# Patient Record
Sex: Male | Born: 1960 | Race: White | Hispanic: No | Marital: Married | Smoking: Former smoker
Health system: Southern US, Community
[De-identification: ages and names within clinical notes are randomized; demographics above are authoritative.]

## PROBLEM LIST (undated history)

## (undated) DIAGNOSIS — J449 Chronic obstructive pulmonary disease, unspecified: Secondary | ICD-10-CM

## (undated) DIAGNOSIS — J45909 Unspecified asthma, uncomplicated: Secondary | ICD-10-CM

## (undated) HISTORY — DX: Unspecified asthma, uncomplicated: J45.909

## (undated) HISTORY — PX: SPINE SURGERY: SHX786

---

## 1999-02-03 ENCOUNTER — Encounter: Payer: Self-pay | Admitting: *Deleted

## 1999-02-03 ENCOUNTER — Observation Stay (HOSPITAL_COMMUNITY): Admission: EM | Admit: 1999-02-03 | Discharge: 1999-02-04 | Payer: Self-pay | Admitting: Emergency Medicine

## 1999-02-04 ENCOUNTER — Encounter: Payer: Self-pay | Admitting: Cardiovascular Disease

## 2008-01-02 ENCOUNTER — Encounter: Admission: RE | Admit: 2008-01-02 | Discharge: 2008-01-02 | Payer: Self-pay | Admitting: Neurosurgery

## 2008-01-15 ENCOUNTER — Encounter: Admission: RE | Admit: 2008-01-15 | Discharge: 2008-01-15 | Payer: Self-pay | Admitting: Neurosurgery

## 2008-11-04 ENCOUNTER — Encounter: Admission: RE | Admit: 2008-11-04 | Discharge: 2008-11-04 | Payer: Self-pay | Admitting: Neurosurgery

## 2008-11-07 ENCOUNTER — Encounter: Admission: RE | Admit: 2008-11-07 | Discharge: 2008-11-07 | Payer: Self-pay | Admitting: Neurosurgery

## 2008-11-25 ENCOUNTER — Encounter: Admission: RE | Admit: 2008-11-25 | Discharge: 2008-11-25 | Payer: Self-pay | Admitting: Neurosurgery

## 2008-12-17 ENCOUNTER — Encounter: Admission: RE | Admit: 2008-12-17 | Discharge: 2008-12-17 | Payer: Self-pay | Admitting: Neurosurgery

## 2020-06-24 LAB — HM COLONOSCOPY

## 2021-01-23 ENCOUNTER — Emergency Department (HOSPITAL_COMMUNITY): Payer: 59

## 2021-01-23 ENCOUNTER — Emergency Department (HOSPITAL_COMMUNITY)
Admission: EM | Admit: 2021-01-23 | Discharge: 2021-01-24 | Disposition: A | Discharge status: Home / Self Care | Payer: 59 | Attending: Emergency Medicine | Admitting: Emergency Medicine

## 2021-01-23 DIAGNOSIS — Z8616 Personal history of COVID-19: Secondary | ICD-10-CM | POA: Diagnosis not present

## 2021-01-23 DIAGNOSIS — Z20822 Contact with and (suspected) exposure to covid-19: Secondary | ICD-10-CM | POA: Diagnosis not present

## 2021-01-23 DIAGNOSIS — R5383 Other fatigue: Secondary | ICD-10-CM | POA: Diagnosis not present

## 2021-01-23 DIAGNOSIS — R0602 Shortness of breath: Secondary | ICD-10-CM | POA: Diagnosis not present

## 2021-01-23 NOTE — ED Provider Notes (Signed)
[facility] EMERGENCY DEPARTMENT Provider Note   CSN: [id number] Arrival date & time: 01/23/21  2303     History Chief Complaint  Patient presents with   Shortness of Breath    Anthony [name] is a 60 y.o. male.  60 y/o male with hx of COPD presents to the ED for SOB and fatigue. Patient reports that his symptoms began acutely at 1800 shortly after arriving at work. They worsened about 2.5 hours later when he was on his break. Began to notice progressive SOB, aggravated by exertion. Tried his albuterol inhaler without relief. Next went to drink his [facility], but felt it didn't taste right. Expresses concern for COVID, but does report hx of COVID positivity in June. Reports subjective fever and chills. No nausea, vomiting, chest pain, syncope. Is a nonsmoker.   The history is provided by the patient. No language interpreter was used.  Shortness of Breath     No past medical history on file.  There are no problems to display for this patient.   **The histories are not reviewed yet. Please review them in the "History" navigator section and refresh this SmartLink.     No family history on file.     Home Medications Prior to Admission medications   Medication Sig Start Date End Date Taking? Authorizing Provider  albuterol (VENTOLIN HFA) 108 (90 Base) MCG/ACT inhaler Inhale 2 puffs into the lungs every 4 (four) hours as needed for wheezing or shortness of breath. 01/24/21  Yes Antony Madura, PA-C    Allergies    Patient has no allergy information on record.  Review of Systems   Review of Systems  Respiratory:  Positive for shortness of breath.   Ten systems reviewed and are negative for acute change, except as noted in the HPI.    Physical Exam Updated Vital Signs BP 126/82   Pulse (!) 57   Temp 97.7 F (36.5 C) (Oral)   Resp 12   Ht 6' (1.829 m)   Wt 107 kg   SpO2 94%   BMI 32.01 kg/m   Physical Exam Vitals and nursing note reviewed.   Constitutional:      General: He is not in acute distress.    Appearance: He is well-developed. He is not diaphoretic.     Comments: Nontoxic appearing male, in NAD  HENT:     Head: Normocephalic and atraumatic.  Eyes:     General: No scleral icterus.    Conjunctiva/sclera: Conjunctivae normal.  Cardiovascular:     Rate and Rhythm: Normal rate and regular rhythm.     Pulses: Normal pulses.  Pulmonary:     Effort: Pulmonary effort is normal. No respiratory distress.     Breath sounds: No stridor. No wheezing, rhonchi or rales.     Comments: Lungs CTAB. No cough appreciated. Sats 97% on RA. Musculoskeletal:        General: Normal range of motion.     Cervical back: Normal range of motion.     Comments: No asymmetric lower extremity edema.  Skin:    General: Skin is warm and dry.     Coloration: Skin is not pale.     Findings: No erythema or rash.  Neurological:     Mental Status: He is alert and oriented to person, place, and time.     Coordination: Coordination normal.  Psychiatric:        Behavior: Behavior normal.    ED Results / Procedures / Treatments  Labs (all labs ordered are listed, but only abnormal results are displayed) Labs Reviewed  BASIC METABOLIC PANEL - Abnormal; Notable for the following components:      Result Value   Potassium 3.4 (*)    Glucose, Bld 118 (*)    BUN 22 (*)    Calcium 8.6 (*)    All other components within normal limits  SARS CORONAVIRUS 2 (TAT 6-24 HRS)  CBC WITH DIFFERENTIAL/PLATELET  TROPONIN I (HIGH SENSITIVITY)  TROPONIN I (HIGH SENSITIVITY)    EKG EKG Interpretation  Date/Time:  Saturday January 23 2021 23:09:48 EDT Ventricular Rate:  77 PR Interval:  139 QRS Duration: 96 QT Interval:  380 QTC Calculation: 430 R Axis:   62 Text Interpretation: Sinus rhythm Normal ECG Confirmed by Geoffery Lyons ([id number]) on 01/23/2021 11:14:21 PM Also confirmed by Geoffery Lyons ([id number]), editor Jillene Bucks [phone])  on 01/24/2021 1:06:52  PM  Radiology DG Chest 2 View  Result Date: 01/23/2021 CLINICAL DATA:  Worsening shortness of breath, fatigue, night sweats, and loss of taste. EXAM: CHEST - 2 VIEW COMPARISON:  None. FINDINGS: Heart size and pulmonary vascularity are normal. Lungs are clear. No pleural effusions. No pneumothorax. Mediastinal contours appear intact. Degenerative changes in the spine. IMPRESSION: No active cardiopulmonary disease. Electronically Signed   By: Burman Nieves M.D.   On: 01/23/2021 23:47     Procedures Procedures   Medications Ordered in ED Medications - No data to display  ED Course  I have reviewed the triage vital signs and the nursing notes.  Pertinent labs & imaging results that were available during my care of the patient were reviewed by me and considered in my medical decision making (see chart for details).  Clinical Course as of 01/27/21 2311  Sun Jan 24, 2021  0129 Labs reviewed and reassuring. VSS. [KH]    Clinical Course User Index [KH] Antony Madura, PA-C   MDM Rules/Calculators/A&P                           Patient presents to the emergency department for evaluation of shortness of breath.  Atypical cardiac etiology considered, but  EKG is nonischemic and troponin negative x 2.  Chest x-ray without evidence of mediastinal widening to suggest dissection.  No pneumothorax, pneumonia, pleural effusion.  COVID test pending, but felt unlikely given reported infection in June.  The patient has become progressively asymptomatic while in the emergency department.  Overall, he reports he is feeling much better.  Denies chest pain.  Has not had any hypoxia on room air.  Question COPD exacerbation.  He is stable for follow-up with his primary care doctor.  Return precautions discussed and provided. Patient discharged in stable condition with no unaddressed concerns.   Final Clinical Impression(s) / ED Diagnoses Final diagnoses:  SOB (shortness of breath)    Rx / DC Orders ED  Discharge Orders          Ordered    albuterol (VENTOLIN HFA) 108 (90 Base) MCG/ACT inhaler  Every 4 hours PRN        01/24/21 0316             Antony Madura, PA-C 01/27/21 2314    Geoffery Lyons, MD 02/02/21 [phone]

## 2021-01-23 NOTE — ED Triage Notes (Signed)
Pt bib ems c/o of worsening sob, fatigue, night sweats, and loss of taste. Pt tried to use albuterol  inhaler with no relief for SOB. Pt states he was exposed to covid in the past week. Hx of COPD.   BP: 138/90  HR: 60-90  RR: 24  T: 97.7  CBG: 163  Spo2: 96% RA

## 2021-01-24 LAB — CBC WITH DIFFERENTIAL/PLATELET
Abs Immature Granulocytes: 0.02 10*3/uL (ref 0.00–0.07)
Basophils Absolute: 0.1 10*3/uL (ref 0.0–0.1)
Basophils Relative: 1 %
Eosinophils Absolute: 0.1 10*3/uL (ref 0.0–0.5)
Eosinophils Relative: 1 %
HCT: 41.2 % (ref 39.0–52.0)
Hemoglobin: 14.1 g/dL (ref 13.0–17.0)
Immature Granulocytes: 0 %
Lymphocytes Relative: 30 %
Lymphs Abs: 2.9 10*3/uL (ref 0.7–4.0)
MCH: 32.3 pg (ref 26.0–34.0)
MCHC: 34.2 g/dL (ref 30.0–36.0)
MCV: 94.3 fL (ref 80.0–100.0)
Monocytes Absolute: 0.8 10*3/uL (ref 0.1–1.0)
Monocytes Relative: 9 %
Neutro Abs: 5.7 10*3/uL (ref 1.7–7.7)
Neutrophils Relative %: 59 %
Platelets: 177 10*3/uL (ref 150–400)
RBC: 4.37 MIL/uL (ref 4.22–5.81)
RDW: 11.9 % (ref 11.5–15.5)
WBC: 9.7 10*3/uL (ref 4.0–10.5)
nRBC: 0 % (ref 0.0–0.2)

## 2021-01-24 LAB — SARS CORONAVIRUS 2 (TAT 6-24 HRS): SARS Coronavirus 2: NEGATIVE

## 2021-01-24 LAB — BASIC METABOLIC PANEL
Anion gap: 7 (ref 5–15)
BUN: 22 mg/dL — ABNORMAL HIGH (ref 6–20)
CO2: 23 mmol/L (ref 22–32)
Calcium: 8.6 mg/dL — ABNORMAL LOW (ref 8.9–10.3)
Chloride: 108 mmol/L (ref 98–111)
Creatinine, Ser: 0.85 mg/dL (ref 0.61–1.24)
GFR, Estimated: >60 mL/min (ref >60)
Glucose, Bld: 118 mg/dL — ABNORMAL HIGH (ref 70–99)
Potassium: 3.4 mmol/L — ABNORMAL LOW (ref 3.5–5.1)
Sodium: 138 mmol/L (ref 135–145)

## 2021-01-24 LAB — TROPONIN I (HIGH SENSITIVITY)
Troponin I (High Sensitivity): 3 ng/L (ref <18)
Troponin I (High Sensitivity): 4 ng/L (ref <18)

## 2021-01-24 MED ORDER — ALBUTEROL SULFATE HFA 108 (90 BASE) MCG/ACT IN AERS: 2.0000 | INHALATION_SPRAY | RESPIRATORY_TRACT | 0 refills | Status: DC | PRN

## 2021-01-24 NOTE — Discharge Instructions (Addendum)
Your work-up in the emergency department has been reassuring.  You can follow-up on the results of your COVID test online through MyChart.  Instructions on how to access this are located in your discharge packet.  We have provided a referral to cardiology given your symptoms, especially since your last stress test was more than 20 years ago.  We advise continuation of any prescribed medications.  You have been given a refill of an albuterol inhaler given that yours was expired.  We recommend return to the emergency department for new or concerning symptoms.

## 2021-02-16 NOTE — Progress Notes (Signed)
Cardiology Office Note:    Date:  02/17/2021   ID:  Anthony [name], DOB [birth year], MRN [id number]  PCP:  Pcp, No  Cardiologist:  None   Referring MD: No ref. provider found   Chief Complaint  Patient presents with   Coronary Artery Disease   Shortness of Breath     History of Present Illness:    Anthony [name] is a 60 y.o. male with a hx of recent ER visit for DOE chest tightness.    On August 20 while working at [employer] he suddenly developed dyspnea and a sensation of tightness in the chest.  States that high it could not catch my breath.  He went to the emergency room and had a negative work-up including a normal EKG and troponin.  Chest x-ray was unremarkable.  Since that time he has not been right.  If he walks around he feels somewhat short of breath.  He saw a primary care Dr. Wyline Mood on 01/27/2021 and was given albuterol and told he was developing COPD.  He has never been a smoker but he did work in [employer] for greater than 20 years and noted terrible air quality which he feels may correlate with the potential for developing a lung problem.  Father had CAD.  A brother 6 years older recently had stents.  Brother is a smoker.  A CT scan was not performed.  D-dimer was normal.  History reviewed. No pertinent past medical history.  History reviewed. No pertinent surgical history.  Current Medications: Current Meds  Medication Sig   albuterol (VENTOLIN HFA) 108 (90 Base) MCG/ACT inhaler Inhale 2 puffs into the lungs every 4 (four) hours as needed for wheezing or shortness of breath.     Allergies:   Morphine and Pentazocine   Social History   Socioeconomic History   Marital status: Married    Spouse name: Not on file   Number of children: Not on file   Years of education: Not on file   Highest education level: Not on file  Occupational History   Not on file  Tobacco Use   Smoking status: Never   Smokeless tobacco: Never  Substance and Sexual  Activity   Alcohol use: Not on file   Drug use: Not on file   Sexual activity: Not on file  Other Topics Concern   Not on file  Social History Narrative   Not on file   Social Determinants of Health   Financial Resource Strain: Not on file  Food Insecurity: Not on file  Transportation Needs: Not on file  Physical Activity: Not on file  Stress: Not on file  Social Connections: Not on file     Family History: The patient's family history includes Asthma in his mother; Cancer in his mother; Heart disease in his father.  ROS:   Please see the history of present illness.    Cardiac imaging.  Never had chest CT.  Wonders if he can work.  Somewhat reluctant to return to work.  All other systems reviewed and are negative.  EKGs/Labs/Other Studies Reviewed:    The following studies were reviewed today: Chest x-ray, blood work, including troponin I were negative on August 21.  EKG:  EKG normal on 01/25/2021  Recent Labs: 01/23/2021: BUN 22; Creatinine, Ser 0.85; Hemoglobin 14.1; Platelets 177; Potassium 3.4; Sodium 138  Recent Lipid Panel No results found for: CHOL, TRIG, HDL, CHOLHDL, VLDL, LDLCALC, LDLDIRECT  Physical Exam:    VS:  BP  130/82   Pulse 70   Ht 6' (1.829 m)   Wt 231 lb 3.2 oz (104.9 kg)   SpO2 97%   BMI 31.36 kg/m     Wt Readings from Last 3 Encounters:  02/17/21 231 lb 3.2 oz (104.9 kg)  01/23/21 236 lb (107 kg)     GEN: Overweight. No acute distress HEENT: Normal NECK: No JVD. LYMPHATICS: No lymphadenopathy CARDIAC: No murmur. RRR no gallop, or edema. VASCULAR:  Normal Pulses. No bruits. RESPIRATORY:  Clear to auscultation without rales, wheezing or rhonchi  ABDOMEN: Soft, non-tender, non-distended, No pulsatile mass, MUSCULOSKELETAL: No deformity  SKIN: Warm and dry NEUROLOGIC:  Alert and oriented x 3 PSYCHIATRIC:  Normal affect   ASSESSMENT:    1. DOE (dyspnea on exertion)   2. Precordial pain    PLAN:    In order of problems listed  above:  2D Doppler echocardiogram, D-dimer, BNP, and coronary CT with FFR if indicated. Sublingual nitroglycerin if discomfort lasts longer than 3 to 5 minutes.  Take nitro while seated.  Add aspirin 81 mg/day.  At work if chest tightness is provoked by physical activity stop and rest.  Notify us.  If he is unable to perform his duties without chest discomfort, he may need to be out until we get to the bottom of this.   We will check a lipid panel, BNP, D-dimer, echo, and coronary CTA with FFR if indicated.  Nitro was given to use of any prolonged episodes of pain.  Instructed to modulate physical activity so that he is under the threshold of tightness in his chest.   Medication Adjustments/Labs and Tests Ordered: Current medicines are reviewed at length with the patient today.  Concerns regarding medicines are outlined above.  No orders of the defined types were placed in this encounter.  No orders of the defined types were placed in this encounter.   There are no Patient Instructions on file for this visit.   Signed, Lesleigh Noe, MD  02/17/2021 10:24 AM    [facility]

## 2021-02-16 NOTE — H&P (View-Only) (Signed)
Cardiology Office Note:    Date:  02/17/2021   ID:  Anthony [name], DOB 02-02-1961, MRN [id number]  PCP:  Pcp, No  Cardiologist:  None   Referring MD: No ref. provider found   Chief Complaint  Patient presents with   Coronary Artery Disease   Shortness of Breath     History of Present Illness:    Anthony [name] is a 60 y.o. male with a hx of recent ER visit for DOE chest tightness.    On August 20 while working at [employer] he suddenly developed dyspnea and a sensation of tightness in the chest.  States that high it could not catch my breath.  He went to the emergency room and had a negative work-up including a normal EKG and troponin.  Chest x-ray was unremarkable.  Since that time he has not been right.  If he walks around he feels somewhat short of breath.  He saw a primary care Dr. Wyline Mood on 01/27/2021 and was given albuterol and told he was developing COPD.  He has never been a smoker but he did work in [employer] for greater than 20 years and noted terrible air quality which he feels may correlate with the potential for developing a lung problem.  Father had CAD.  A brother 6 years older recently had stents.  Brother is a smoker.  A CT scan was not performed.  D-dimer was normal.  History reviewed. No pertinent past medical history.  History reviewed. No pertinent surgical history.  Current Medications: Current Meds  Medication Sig   albuterol (VENTOLIN HFA) 108 (90 Base) MCG/ACT inhaler Inhale 2 puffs into the lungs every 4 (four) hours as needed for wheezing or shortness of breath.     Allergies:   Morphine and Pentazocine   Social History   Socioeconomic History   Marital status: Married    Spouse name: Not on file   Number of children: Not on file   Years of education: Not on file   Highest education level: Not on file  Occupational History   Not on file  Tobacco Use   Smoking status: Never   Smokeless tobacco: Never  Substance and Sexual  Activity   Alcohol use: Not on file   Drug use: Not on file   Sexual activity: Not on file  Other Topics Concern   Not on file  Social History Narrative   Not on file   Social Determinants of Health   Financial Resource Strain: Not on file  Food Insecurity: Not on file  Transportation Needs: Not on file  Physical Activity: Not on file  Stress: Not on file  Social Connections: Not on file     Family History: The patient's family history includes Asthma in his mother; Cancer in his mother; Heart disease in his father.  ROS:   Please see the history of present illness.    Cardiac imaging.  Never had chest CT.  Wonders if he can work.  Somewhat reluctant to return to work.  All other systems reviewed and are negative.  EKGs/Labs/Other Studies Reviewed:    The following studies were reviewed today: Chest x-ray, blood work, including troponin I were negative on August 21.  EKG:  EKG normal on 01/25/2021  Recent Labs: 01/23/2021: BUN 22; Creatinine, Ser 0.85; Hemoglobin 14.1; Platelets 177; Potassium 3.4; Sodium 138  Recent Lipid Panel No results found for: CHOL, TRIG, HDL, CHOLHDL, VLDL, LDLCALC, LDLDIRECT  Physical Exam:    VS:  BP  130/82   Pulse 70   Ht 6' (1.829 m)   Wt 231 lb 3.2 oz (104.9 kg)   SpO2 97%   BMI 31.36 kg/m     Wt Readings from Last 3 Encounters:  02/17/21 231 lb 3.2 oz (104.9 kg)  01/23/21 236 lb (107 kg)     GEN: Overweight. No acute distress HEENT: Normal NECK: No JVD. LYMPHATICS: No lymphadenopathy CARDIAC: No murmur. RRR no gallop, or edema. VASCULAR:  Normal Pulses. No bruits. RESPIRATORY:  Clear to auscultation without rales, wheezing or rhonchi  ABDOMEN: Soft, non-tender, non-distended, No pulsatile mass, MUSCULOSKELETAL: No deformity  SKIN: Warm and dry NEUROLOGIC:  Alert and oriented x 3 PSYCHIATRIC:  Normal affect   ASSESSMENT:    1. DOE (dyspnea on exertion)   2. Precordial pain    PLAN:    In order of problems listed  above:  2D Doppler echocardiogram, D-dimer, BNP, and coronary CT with FFR if indicated. Sublingual nitroglycerin if discomfort lasts longer than 3 to 5 minutes.  Take nitro while seated.  Add aspirin 81 mg/day.  At work if chest tightness is provoked by physical activity stop and rest.  Notify us.  If he is unable to perform his duties without chest discomfort, he may need to be out until we get to the bottom of this.   We will check a lipid panel, BNP, D-dimer, echo, and coronary CTA with FFR if indicated.  Nitro was given to use of any prolonged episodes of pain.  Instructed to modulate physical activity so that he is under the threshold of tightness in his chest.   Medication Adjustments/Labs and Tests Ordered: Current medicines are reviewed at length with the patient today.  Concerns regarding medicines are outlined above.  No orders of the defined types were placed in this encounter.  No orders of the defined types were placed in this encounter.   There are no Patient Instructions on file for this visit.   Signed, Lesleigh Noe, MD  02/17/2021 10:24 AM    [facility]

## 2021-02-17 ENCOUNTER — Encounter (HOSPITAL_COMMUNITY): Payer: Self-pay

## 2021-02-17 ENCOUNTER — Encounter: Payer: Self-pay | Admitting: Interventional Cardiology

## 2021-02-17 ENCOUNTER — Other Ambulatory Visit: Payer: Self-pay | Admitting: *Deleted

## 2021-02-17 ENCOUNTER — Emergency Department (HOSPITAL_COMMUNITY)
Admission: EM | Admit: 2021-02-17 | Discharge: 2021-02-17 | Disposition: A | Discharge status: Home / Self Care | Payer: 59 | Attending: Emergency Medicine | Admitting: Emergency Medicine

## 2021-02-17 ENCOUNTER — Ambulatory Visit: Payer: 59 | Admitting: Interventional Cardiology

## 2021-02-17 ENCOUNTER — Emergency Department (HOSPITAL_COMMUNITY): Payer: 59

## 2021-02-17 ENCOUNTER — Other Ambulatory Visit: Payer: Self-pay

## 2021-02-17 VITALS — BP 130/82 | HR 70 | Ht 72.0 in | Wt 231.2 lb

## 2021-02-17 DIAGNOSIS — R0609 Other forms of dyspnea: Secondary | ICD-10-CM

## 2021-02-17 DIAGNOSIS — R079 Chest pain, unspecified: Secondary | ICD-10-CM

## 2021-02-17 DIAGNOSIS — R0600 Dyspnea, unspecified: Secondary | ICD-10-CM

## 2021-02-17 DIAGNOSIS — R0789 Other chest pain: Secondary | ICD-10-CM | POA: Diagnosis not present

## 2021-02-17 DIAGNOSIS — Z79899 Other long term (current) drug therapy: Secondary | ICD-10-CM | POA: Insufficient documentation

## 2021-02-17 DIAGNOSIS — Z7982 Long term (current) use of aspirin: Secondary | ICD-10-CM | POA: Insufficient documentation

## 2021-02-17 DIAGNOSIS — J449 Chronic obstructive pulmonary disease, unspecified: Secondary | ICD-10-CM | POA: Insufficient documentation

## 2021-02-17 DIAGNOSIS — R0602 Shortness of breath: Secondary | ICD-10-CM

## 2021-02-17 DIAGNOSIS — R072 Precordial pain: Secondary | ICD-10-CM

## 2021-02-17 DIAGNOSIS — I2584 Coronary atherosclerosis due to calcified coronary lesion: Secondary | ICD-10-CM | POA: Insufficient documentation

## 2021-02-17 DIAGNOSIS — I2510 Atherosclerotic heart disease of native coronary artery without angina pectoris: Secondary | ICD-10-CM | POA: Insufficient documentation

## 2021-02-17 HISTORY — DX: Chronic obstructive pulmonary disease, unspecified: J44.9

## 2021-02-17 LAB — CBC WITH DIFFERENTIAL/PLATELET
Abs Immature Granulocytes: 0.03 10*3/uL (ref 0.00–0.07)
Basophils Absolute: 0.1 10*3/uL (ref 0.0–0.1)
Basophils Relative: 1 %
Eosinophils Absolute: 0.3 10*3/uL (ref 0.0–0.5)
Eosinophils Relative: 4 %
HCT: 43.8 % (ref 39.0–52.0)
Hemoglobin: 15.1 g/dL (ref 13.0–17.0)
Immature Granulocytes: 0 %
Lymphocytes Relative: 35 %
Lymphs Abs: 2.8 10*3/uL (ref 0.7–4.0)
MCH: 32.4 pg (ref 26.0–34.0)
MCHC: 34.5 g/dL (ref 30.0–36.0)
MCV: 94 fL (ref 80.0–100.0)
Monocytes Absolute: 0.8 10*3/uL (ref 0.1–1.0)
Monocytes Relative: 10 %
Neutro Abs: 3.8 10*3/uL (ref 1.7–7.7)
Neutrophils Relative %: 50 %
Platelets: 188 10*3/uL (ref 150–400)
RBC: 4.66 MIL/uL (ref 4.22–5.81)
RDW: 12.1 % (ref 11.5–15.5)
WBC: 7.8 10*3/uL (ref 4.0–10.5)
nRBC: 0 % (ref 0.0–0.2)

## 2021-02-17 LAB — COMPREHENSIVE METABOLIC PANEL
ALT: 27 U/L (ref 0–44)
AST: 21 U/L (ref 15–41)
Albumin: 4.1 g/dL (ref 3.5–5.0)
Alkaline Phosphatase: 79 U/L (ref 38–126)
Anion gap: 8 (ref 5–15)
BUN: 17 mg/dL (ref 6–20)
CO2: 25 mmol/L (ref 22–32)
Calcium: 8.6 mg/dL — ABNORMAL LOW (ref 8.9–10.3)
Chloride: 106 mmol/L (ref 98–111)
Creatinine, Ser: 0.98 mg/dL (ref 0.61–1.24)
GFR, Estimated: >60 mL/min (ref >60)
Glucose, Bld: 108 mg/dL — ABNORMAL HIGH (ref 70–99)
Potassium: 3.7 mmol/L (ref 3.5–5.1)
Sodium: 139 mmol/L (ref 135–145)
Total Bilirubin: 0.4 mg/dL (ref 0.3–1.2)
Total Protein: 6.9 g/dL (ref 6.5–8.1)

## 2021-02-17 LAB — D-DIMER, QUANTITATIVE: D-DIMER: 2.53 mg/L FEU — ABNORMAL HIGH (ref 0.00–0.49)

## 2021-02-17 LAB — TROPONIN I (HIGH SENSITIVITY)
Troponin I (High Sensitivity): <2 ng/L (ref <18)
Troponin I (High Sensitivity): <2 ng/L (ref <18)

## 2021-02-17 MED ORDER — NITROGLYCERIN 0.4 MG SL SUBL: 0.4000 mg | SUBLINGUAL_TABLET | SUBLINGUAL | 3 refills | Status: DC | PRN

## 2021-02-17 MED ORDER — AEROCHAMBER Z-STAT PLUS/MEDIUM MISC
1.0000 | Freq: Once | Status: AC
  Administered 2021-02-17: 1

## 2021-02-17 MED ORDER — PREDNISONE 50 MG PO TABS: 50.0000 mg | ORAL_TABLET | Freq: Every day | ORAL | 0 refills | Status: AC

## 2021-02-17 MED ORDER — METOPROLOL TARTRATE 100 MG PO TABS: ORAL_TABLET | ORAL | 0 refills | Status: DC

## 2021-02-17 MED ORDER — ALBUTEROL SULFATE HFA 108 (90 BASE) MCG/ACT IN AERS
4.0000 | INHALATION_SPRAY | Freq: Once | RESPIRATORY_TRACT | Status: AC
  Administered 2021-02-17: 4 via RESPIRATORY_TRACT
  Filled 2021-02-17: qty 6.7

## 2021-02-17 MED ORDER — IOHEXOL 350 MG/ML SOLN
100.0000 mL | Freq: Once | INTRAVENOUS | Status: AC | PRN
  Administered 2021-02-17: 75 mL via INTRAVENOUS

## 2021-02-17 MED ORDER — ASPIRIN EC 81 MG PO TBEC: 81.0000 mg | DELAYED_RELEASE_TABLET | Freq: Every day | ORAL | 3 refills | Status: DC

## 2021-02-17 NOTE — ED Notes (Addendum)
  Patient ambulated with pulse oximetry and tolerated well.  Patient maintained SPO2 96-98%.  No increased WOB or tachypnea.  Dr Audley Hose notified.

## 2021-02-17 NOTE — ED Triage Notes (Signed)
Pt to er, pt states that he had a d dimer checked today and it was elevated, states that he feels sob and has a little bit of chest pain.  Pt states that he also has some htn

## 2021-02-17 NOTE — Discharge Instructions (Signed)
Please follow up with your cardiologist. If your cardiology testing is unremarkable you may need to be referred to pulmonology by your regular doctor.    Please return to the emergency department for any new or worsening symptoms.

## 2021-02-17 NOTE — Patient Instructions (Addendum)
Medication Instructions:  1) START Aspirin 81mg  once daily 2)  A prescription has been sent in for Nitroglycerin.  If you have chest pain that doesn't relieve quickly, place one tablet under your tongue and allow it to dissolve.  If no relief after 5 minutes, you may take another pill.  If no relief after 5 minutes, you may take a 3rd dose but you need to call 911 and report to ER immediately.  *If you need a refill on your cardiac medications before your next appointment, please call your pharmacy*   Lab Work: Pro BNP, Lipid and DDimer today  If you have labs (blood work) drawn today and your tests are completely normal, you will receive your results only by: MyChart Message (if you have MyChart) OR A paper copy in the mail If you have any lab test that is abnormal or we need to change your treatment, we will call you to review the results.   Testing/Procedures: Your physician has requested that you have an echocardiogram. Echocardiography is a painless test that uses sound waves to create images of your heart. It provides your doctor with information about the size and shape of your heart and how well your heart's chambers and valves are working. This procedure takes approximately one hour. There are no restrictions for this procedure.  Your physician recommends that you have a Coronary CT performed.  See below for instructions.   Follow-Up: At [facility], you and your health needs are our priority.  As part of our continuing mission to provide you with exceptional heart care, we have created designated Provider Care Teams.  These Care Teams include your primary Cardiologist (physician) and Advanced Practice Providers (APPs -  Physician Assistants and Nurse Practitioners) who all work together to provide you with the care you need, when you need it.  We recommend signing up for the patient portal called "MyChart".  Sign up information is provided on this After Visit Summary.  MyChart  is used to connect with patients for Virtual Visits (Telemedicine).  Patients are able to view lab/test results, encounter notes, upcoming appointments, etc.  Non-urgent messages can be sent to your provider as well.   To learn more about what you can do with MyChart, go to [facility].    Your next appointment:   As needed  The format for your next appointment:   In Person  Provider:   You may see [email or web address], III, MD or one of the following Advanced Practice Providers on your designated Care Team:   Verdis Prime, NP    Other Instructions    Your cardiac CT will be scheduled at one of the below locations:   [facility] [address] [place], [place] [phone]  OR  [facility] [address] [place], [place] [phone]  If scheduled at [facility], please arrive at the [facility] main entrance (entrance A) of [facility] 30 minutes prior to test start time. Proceed to the [facility] Radiology Department (first floor) to check-in and test prep.  If scheduled at [facility], please arrive 15 mins early for check-in and test prep.  Please follow these instructions carefully (unless otherwise directed):  Hold all erectile dysfunction medications at least 3 days (72 hrs) prior to test.  On the Night Before the Test: Be sure to Drink plenty of water. Do not consume any caffeinated/decaffeinated beverages or chocolate 12 hours prior  to your test. Do not take any antihistamines 12 hours prior to your test.  On the Day of the Test: Drink plenty of water until 1 hour prior to the test. Do not eat any food 4 hours prior to the test. You may take your regular medications prior to the test.  Take metoprolol (Lopressor) two hours prior to test. HOLD Furosemide/Hydrochlorothiazide morning of the test.       After the Test: Drink plenty of  water. After receiving IV contrast, you may experience a mild flushed feeling. This is normal. On occasion, you may experience a mild rash up to 24 hours after the test. This is not dangerous. If this occurs, you can take Benadryl 25 mg and increase your fluid intake. If you experience trouble breathing, this can be serious. If it is severe call 911 IMMEDIATELY. If it is mild, please call our office. If you take any of these medications: Glipizide/Metformin, Avandament, Glucavance, please do not take 48 hours after completing test unless otherwise instructed.  Please allow 2-4 weeks for scheduling of routine cardiac CTs. Some insurance companies require a pre-authorization which may delay scheduling of this test.   For non-scheduling related questions, please contact the cardiac imaging nurse navigator should you have any questions/concerns: Rockwell [place], Cardiac Imaging Nurse Navigator Larey Brick, Cardiac Imaging Nurse Navigator [facility] Direct Office Dial: [phone]   For scheduling needs, including cancellations and rescheduling, please call [place], [phone].

## 2021-02-17 NOTE — ED Provider Notes (Signed)
[facility] EMERGENCY DEPARTMENT Provider Note   CSN: [id number] Arrival date & time: 02/17/21  1832     History Chief Complaint  Patient presents with   Abnormal Lab   Chest Pain    Anthony [name] is a 60 y.o. male.  HPI   Pt is a 60 y/o male with a h/o COPD who presents to the ED today for eval of SOB that has been ongoing for 1 month. States he was seen in the ED for sxs 1 month ago and had a reassuring workup. He f/u with cardiology today and a ddimer was ordered which was positive so he was sent to the ED. He reports some intermittent chest tightness/pain. Denies leg swelling, recent travel or hospital admissions. Did have covid in June 2022   Past Medical History:  Diagnosis Date   COPD (chronic obstructive pulmonary disease) (HCC)     There are no problems to display for this patient.   History reviewed. No pertinent surgical history.     Family History  Problem Relation Age of Onset   Asthma Mother    Cancer Mother    Heart disease Father     Social History   Tobacco Use   Smoking status: Never   Smokeless tobacco: Never  Vaping Use   Vaping Use: Never used  Substance Use Topics   Alcohol use: Yes   Drug use: Never    Home Medications Prior to Admission medications   Medication Sig Start Date End Date Taking? Authorizing Provider  albuterol (VENTOLIN HFA) 108 (90 Base) MCG/ACT inhaler Inhale 2 puffs into the lungs every 4 (four) hours as needed for wheezing or shortness of breath. 01/24/21  Yes Antony Madura, PA-C  aspirin EC 81 MG tablet Take 1 tablet (81 mg total) by mouth daily. Swallow whole. 02/17/21  Yes Lyn Records, MD  Multiple Vitamin (MULTIVITAMIN) tablet Take 1 tablet by mouth daily.   Yes [provider]  nitroGLYCERIN (NITROSTAT) 0.4 MG SL tablet Place 1 tablet (0.4 mg total) under the tongue every 5 (five) minutes as needed for chest pain. 02/17/21 05/18/21 Yes Lyn Records, MD  predniSONE (DELTASONE) 50 MG tablet Take 1  tablet (50 mg total) by mouth daily for 5 days. 02/17/21 02/22/21 Yes Dashanti Burr S, PA-C  metoprolol tartrate (LOPRESSOR) 100 MG tablet Take one tablet by mouth 2 hours prior to CT 02/17/21   Lyn Records, MD    Allergies    Morphine and Pentazocine  Review of Systems   Review of Systems  Constitutional:  Negative for fever.  HENT:  Negative for ear pain and sore throat.   Eyes:  Negative for visual disturbance.  Respiratory:  Positive for shortness of breath. Negative for cough.   Cardiovascular:  Positive for chest pain.  Gastrointestinal:  Negative for abdominal pain, constipation, diarrhea, nausea and vomiting.  Genitourinary:  Negative for dysuria and hematuria.  Musculoskeletal:  Negative for back pain.  Skin:  Negative for color change and rash.  Neurological:  Negative for seizures and syncope.  All other systems reviewed and are negative.  Physical Exam Updated Vital Signs BP 115/84   Pulse 71   Temp 97.8 F (36.6 C) (Oral)   Resp 20   Ht 6' (1.829 m)   Wt 104.8 kg   SpO2 98%   BMI 31.33 kg/m   Physical Exam Vitals and nursing note reviewed.  Constitutional:      Appearance: He is well-developed.  HENT:  Head: Normocephalic and atraumatic.  Eyes:     Conjunctiva/sclera: Conjunctivae normal.  Cardiovascular:     Rate and Rhythm: Normal rate and regular rhythm.     Heart sounds: No murmur heard. Pulmonary:     Effort: Pulmonary effort is normal. No respiratory distress.     Breath sounds: Normal breath sounds. No decreased breath sounds, wheezing, rhonchi or rales.  Abdominal:     General: Bowel sounds are normal.     Palpations: Abdomen is soft.     Tenderness: There is no abdominal tenderness.  Musculoskeletal:     Cervical back: Neck supple.     Right lower leg: No tenderness. No edema.     Left lower leg: No tenderness. No edema.  Skin:    General: Skin is warm and dry.  Neurological:     Mental Status: He is alert.    ED Results /  Procedures / Treatments   Labs (all labs ordered are listed, but only abnormal results are displayed) Labs Reviewed  COMPREHENSIVE METABOLIC PANEL - Abnormal; Notable for the following components:      Result Value   Glucose, Bld 108 (*)    Calcium 8.6 (*)    All other components within normal limits  CBC WITH DIFFERENTIAL/PLATELET  TROPONIN I (HIGH SENSITIVITY)  TROPONIN I (HIGH SENSITIVITY)    EKG None  Radiology CT Angio Chest PE W and/or Wo Contrast  Result Date: 02/17/2021 CLINICAL DATA:  Shortness of breath and chest pain x1 week. EXAM: CT ANGIOGRAPHY CHEST WITH CONTRAST TECHNIQUE: Multidetector CT imaging of the chest was performed using the standard protocol during bolus administration of intravenous contrast. Multiplanar CT image reconstructions and MIPs were obtained to evaluate the vascular anatomy. CONTRAST:  22mL OMNIPAQUE IOHEXOL 350 MG/ML SOLN COMPARISON:  None. FINDINGS: Cardiovascular: Satisfactory opacification of the pulmonary arteries to the segmental level. No evidence of pulmonary embolism. Normal heart size with mild to moderate severity coronary artery calcification. No pericardial effusion. Mediastinum/Nodes: No enlarged mediastinal, hilar, or axillary lymph nodes. Thyroid gland, trachea, and esophagus demonstrate no significant findings. Lungs/Pleura: Mild atelectasis is seen within the inferior aspect of the left upper lobe and bilateral lung bases. There is no evidence of a pleural effusion or pneumothorax. Upper Abdomen: No acute abnormality. Musculoskeletal: No chest wall abnormality. No acute or significant osseous findings. Review of the MIP images confirms the above findings. IMPRESSION: 1. No CT evidence of pulmonary embolism. 2. Mild left upper lobe and bibasilar atelectasis. Electronically Signed   By: Aram Candela M.D.   On: 02/17/2021 20:40    Procedures Procedures   Medications Ordered in ED Medications  aerochamber Z-Stat Plus/medium 1 each (has  no administration in time range)  albuterol (VENTOLIN HFA) 108 (90 Base) MCG/ACT inhaler 4 puff (4 puffs Inhalation Given 02/17/21 1928)  iohexol (OMNIPAQUE) 350 MG/ML injection 100 mL (75 mLs Intravenous Contrast Given 02/17/21 2026)    ED Course  I have reviewed the triage vital signs and the nursing notes.  Pertinent labs & imaging results that were available during my care of the patient were reviewed by me and considered in my medical decision making (see chart for details).    MDM Rules/Calculators/A&P                          60 y/o M presenting for eval of sob and chest pain with elevated ddimer  Reviewed/interpreted labs CBC wnl CMP grossly unremarkable Trop neg, delta trop neg  EKG - with NSR, abnormal rwave progression, normal transition  CTA chest - : 1. No CT evidence of pulmonary embolism. 2. Mild left upper lobe and bibasilar atelectasis  Patient ambulated in the department and had no evidence of desaturation or significant dyspnea on exertion.  He did feel improved after having an albuterol inhaler while in the emergency department.  We will send him home with a spacer and I will also give him a course of prednisone as he was previously diagnosed with possibly early changes of COPD by his PCP.  I further advised that he keep his appointment with cardiology and return if symptoms worsen in the meantime.  He is agreeable with this plan and has no further questions.   Final Clinical Impression(s) / ED Diagnoses Final diagnoses:  Dyspnea on exertion    Rx / DC Orders ED Discharge Orders          Ordered    predniSONE (DELTASONE) 50 MG tablet  Daily        02/17/21 2205             Rayne Du 02/17/21 2205    Cheryll Cockayne, MD 03/10/21 1711

## 2021-02-18 LAB — PRO B NATRIURETIC PEPTIDE: NT-Pro BNP: 93 pg/mL (ref 0–210)

## 2021-02-22 ENCOUNTER — Telehealth (HOSPITAL_COMMUNITY): Payer: Self-pay | Admitting: Emergency Medicine

## 2021-02-22 NOTE — Telephone Encounter (Signed)
Attempted to call patient regarding upcoming cardiac CT appointment. °Left message on voicemail with name and callback number °Gaytha Raybourn RN Navigator Cardiac Imaging °[facility] °[phone] Office °[phone] Cell ° °

## 2021-02-23 ENCOUNTER — Telehealth (HOSPITAL_COMMUNITY): Payer: Self-pay | Admitting: Emergency Medicine

## 2021-02-23 NOTE — Telephone Encounter (Signed)
Reaching out to patient to offer assistance regarding upcoming cardiac imaging study; pt verbalizes understanding of appt date/time, parking situation and where to check in, pre-test NPO status and medications ordered, and verified current allergies; name and call back number provided for further questions should they arise Rockwell Alexandria RN Navigator Cardiac Imaging [facility] [phone] office [phone] cell  100mg  metoprolol  Denies claustro Denies iv issues

## 2021-02-24 ENCOUNTER — Telehealth: Payer: Self-pay | Admitting: Interventional Cardiology

## 2021-02-24 ENCOUNTER — Other Ambulatory Visit: Payer: Self-pay

## 2021-02-24 ENCOUNTER — Ambulatory Visit (HOSPITAL_COMMUNITY)
Admission: RE | Admit: 2021-02-24 | Discharge: 2021-02-24 | Disposition: A | Discharge status: Home / Self Care | Payer: 59 | Source: Ambulatory Visit | Attending: Interventional Cardiology | Admitting: Interventional Cardiology

## 2021-02-24 DIAGNOSIS — R072 Precordial pain: Secondary | ICD-10-CM | POA: Diagnosis not present

## 2021-02-24 MED ORDER — NITROGLYCERIN 0.4 MG SL SUBL
SUBLINGUAL_TABLET | SUBLINGUAL | Status: AC
  Filled 2021-02-24: qty 2

## 2021-02-24 MED ORDER — NITROGLYCERIN 0.4 MG SL SUBL
0.8000 mg | SUBLINGUAL_TABLET | Freq: Once | SUBLINGUAL | Status: AC
  Administered 2021-02-24: 0.8 mg via SUBLINGUAL

## 2021-02-24 MED ORDER — IOHEXOL 350 MG/ML SOLN
100.0000 mL | Freq: Once | INTRAVENOUS | Status: AC | PRN
  Administered 2021-02-24: 100 mL via INTRAVENOUS

## 2021-02-24 NOTE — Telephone Encounter (Signed)
Forms from [facility] received on 02/24/2021. Completed patient auth attached. Took form to Dr Katrinka Blazing box for completion. JMM 02/24/2021

## 2021-02-25 ENCOUNTER — Encounter: Payer: Self-pay | Admitting: *Deleted

## 2021-02-25 ENCOUNTER — Other Ambulatory Visit: Payer: Self-pay | Admitting: *Deleted

## 2021-02-25 ENCOUNTER — Telehealth: Payer: Self-pay | Admitting: Interventional Cardiology

## 2021-02-25 DIAGNOSIS — R0600 Dyspnea, unspecified: Secondary | ICD-10-CM

## 2021-02-25 DIAGNOSIS — R079 Chest pain, unspecified: Secondary | ICD-10-CM

## 2021-02-25 DIAGNOSIS — R0609 Other forms of dyspnea: Secondary | ICD-10-CM

## 2021-02-25 NOTE — Telephone Encounter (Signed)
Spoke with pt and reviewed cath instructions.  Will route to Dr. Katrinka Blazing to document that he spoke with pt about risk and benefits of the procedure.

## 2021-02-25 NOTE — Telephone Encounter (Signed)
Pt is returning call from earlier today, he said he was speaking with someone and the call dropped. Please advise pt further

## 2021-02-28 NOTE — H&P (Addendum)
Continued limiting dyspnea and chest discomfort despite low risk COR CTA and negative V/Q. Given risk profile, coronary angio and right heart cath to be done and refer to pulmonary if needed/no coronary disease or other explanation.  COR CTA 02/24/2021: IMPRESSION: 1. 3 vessel coronary calcium score 243 which is 82nd percentile for age/sex   2.  Normal ascending aortic diameter 3.7 cm   3.  Non obstructive CAD CAD RADS 2 see description above   Charlton Haws  Chest CTA 02/17/2021   IMPRESSION: 1. No CT evidence of pulmonary embolism. 2. Mild left upper lobe and bibasilar atelectasis.    On 02/25/2021, continued symptoms were discussed with the patient.  Based upon patient's risk factors and typical nature of clinical symptoms despite the low risk coronary CTA, I recommended coronary angiography.  Coronary angiography, right and eft heart cath, and possible PCI were discussed with the patient in detail.  This included the pros and cons of the procedure.  The risks including stroke, death, myocardial infarction, bleeding, allergy to contrast, and kidney injury were discussed in detail with the patient.  Of these risks, bleeding was noted to be the most likely to occur.  The risk of serious complications is estimated to be less than 100.  Risk of bleeding 1 and 50.  Considering his ongoing symptoms and are searched for a definitive answer, the patient consented to proceeding with coronary angiography.

## 2021-03-01 ENCOUNTER — Telehealth: Payer: Self-pay | Admitting: *Deleted

## 2021-03-01 NOTE — Telephone Encounter (Signed)
Cardiac catheterization scheduled at [facility] for: Tuesday March 02, 2021 7:30 AM [facility] Main Entrance A [facility]) at: 5:30 AM   No solid food after midnight prior to cath, clear liquids until 5 AM day of procedure.   Usual morning medications can be taken pre-cath with sips of water including aspirin 81 mg.    Confirmed patient has responsible adult to drive home post procedure and be with patient first 24 hours after arriving home.   You are allowed one visitor in the waiting room during the time you are at the hospital for your procedure. Both you and your visitor must wear a mask once you enter the hospital.   Patient reports does not currently have any symptoms concerning for COVID-19 and no household members with COVID-19 like illness.                            Reviewed procedure/mask.visitor instructions with patient.

## 2021-03-02 ENCOUNTER — Ambulatory Visit (HOSPITAL_COMMUNITY)
Admission: RE | Admit: 2021-03-02 | Discharge: 2021-03-02 | Disposition: A | Discharge status: Home / Self Care | Payer: 59 | Attending: Interventional Cardiology | Admitting: Interventional Cardiology

## 2021-03-02 ENCOUNTER — Encounter (HOSPITAL_COMMUNITY): Payer: Self-pay | Admitting: Interventional Cardiology

## 2021-03-02 ENCOUNTER — Other Ambulatory Visit: Payer: Self-pay

## 2021-03-02 ENCOUNTER — Ambulatory Visit (HOSPITAL_COMMUNITY)
Admission: RE | Disposition: A | Discharge status: Home / Self Care | Payer: Self-pay | Source: Home / Self Care | Attending: Interventional Cardiology

## 2021-03-02 DIAGNOSIS — R079 Chest pain, unspecified: Secondary | ICD-10-CM | POA: Diagnosis present

## 2021-03-02 DIAGNOSIS — I2510 Atherosclerotic heart disease of native coronary artery without angina pectoris: Secondary | ICD-10-CM | POA: Diagnosis present

## 2021-03-02 DIAGNOSIS — R0600 Dyspnea, unspecified: Secondary | ICD-10-CM | POA: Diagnosis not present

## 2021-03-02 DIAGNOSIS — Z885 Allergy status to narcotic agent status: Secondary | ICD-10-CM | POA: Insufficient documentation

## 2021-03-02 DIAGNOSIS — Z8249 Family history of ischemic heart disease and other diseases of the circulatory system: Secondary | ICD-10-CM | POA: Diagnosis not present

## 2021-03-02 DIAGNOSIS — R0609 Other forms of dyspnea: Secondary | ICD-10-CM

## 2021-03-02 HISTORY — PX: RIGHT/LEFT HEART CATH AND CORONARY ANGIOGRAPHY: CATH118266

## 2021-03-02 LAB — POCT I-STAT EG7
Acid-Base Excess: 0 mmol/L (ref 0.0–2.0)
Bicarbonate: 26.2 mmol/L (ref 20.0–28.0)
Calcium, Ion: 1.18 mmol/L (ref 1.15–1.40)
HCT: 42 % (ref 39.0–52.0)
Hemoglobin: 14.3 g/dL (ref 13.0–17.0)
O2 Saturation: 72 %
Potassium: 4.1 mmol/L (ref 3.5–5.1)
Sodium: 141 mmol/L (ref 135–145)
TCO2: 28 mmol/L (ref 22–32)
pCO2, Ven: 48.9 mmHg (ref 44.0–60.0)
pH, Ven: 7.336 (ref 7.250–7.430)
pO2, Ven: 41 mmHg (ref 32.0–45.0)

## 2021-03-02 LAB — POCT I-STAT 7, (LYTES, BLD GAS, ICA,H+H)
Acid-base deficit: 1 mmol/L (ref 0.0–2.0)
Bicarbonate: 25 mmol/L (ref 20.0–28.0)
Calcium, Ion: 1.21 mmol/L (ref 1.15–1.40)
HCT: 42 % (ref 39.0–52.0)
Hemoglobin: 14.3 g/dL (ref 13.0–17.0)
O2 Saturation: 99 %
Potassium: 4 mmol/L (ref 3.5–5.1)
Sodium: 141 mmol/L (ref 135–145)
TCO2: 26 mmol/L (ref 22–32)
pCO2 arterial: 44 mmHg (ref 32.0–48.0)
pH, Arterial: 7.362 (ref 7.350–7.450)
pO2, Arterial: 141 mmHg — ABNORMAL HIGH (ref 83.0–108.0)

## 2021-03-02 SURGERY — RIGHT/LEFT HEART CATH AND CORONARY ANGIOGRAPHY
Anesthesia: LOCAL

## 2021-03-02 MED ORDER — ACETAMINOPHEN 325 MG PO TABS: 650.0000 mg | ORAL_TABLET | ORAL | Status: DC | PRN

## 2021-03-02 MED ORDER — ONDANSETRON HCL 4 MG/2ML IJ SOLN: 4.0000 mg | Freq: Four times a day (QID) | INTRAMUSCULAR | Status: DC | PRN

## 2021-03-02 MED ORDER — SODIUM CHLORIDE 0.9 % IV SOLN: 250.0000 mL | INTRAVENOUS | Status: DC | PRN

## 2021-03-02 MED ORDER — SODIUM CHLORIDE 0.9% FLUSH: 3.0000 mL | Freq: Two times a day (BID) | INTRAVENOUS | Status: DC

## 2021-03-02 MED ORDER — ASPIRIN 81 MG PO CHEW: 81.0000 mg | CHEWABLE_TABLET | ORAL | Status: DC

## 2021-03-02 MED ORDER — IOHEXOL 350 MG/ML SOLN
INTRAVENOUS | Status: DC | PRN
  Administered 2021-03-02: 120 mL via INTRA_ARTERIAL

## 2021-03-02 MED ORDER — HEPARIN SODIUM (PORCINE) 1000 UNIT/ML IJ SOLN
INTRAMUSCULAR | Status: AC
  Filled 2021-03-02: qty 1

## 2021-03-02 MED ORDER — SODIUM CHLORIDE 0.9% FLUSH: 3.0000 mL | INTRAVENOUS | Status: DC | PRN

## 2021-03-02 MED ORDER — VERAPAMIL HCL 2.5 MG/ML IV SOLN
INTRAVENOUS | Status: DC | PRN
  Administered 2021-03-02: 10 mL via INTRA_ARTERIAL

## 2021-03-02 MED ORDER — LABETALOL HCL 5 MG/ML IV SOLN: 10.0000 mg | INTRAVENOUS | Status: DC | PRN

## 2021-03-02 MED ORDER — SODIUM CHLORIDE 0.9 % IV SOLN: INTRAVENOUS | Status: DC

## 2021-03-02 MED ORDER — HEPARIN SODIUM (PORCINE) 1000 UNIT/ML IJ SOLN
INTRAMUSCULAR | Status: DC | PRN
  Administered 2021-03-02: 5000 [IU] via INTRAVENOUS

## 2021-03-02 MED ORDER — FENTANYL CITRATE (PF) 100 MCG/2ML IJ SOLN
INTRAMUSCULAR | Status: AC
  Filled 2021-03-02: qty 2

## 2021-03-02 MED ORDER — SODIUM CHLORIDE 0.9 % WEIGHT BASED INFUSION
3.0000 mL/kg/h | INTRAVENOUS | Status: AC
  Administered 2021-03-02: 3 mL/kg/h via INTRAVENOUS

## 2021-03-02 MED ORDER — MIDAZOLAM HCL 2 MG/2ML IJ SOLN
INTRAMUSCULAR | Status: DC | PRN
  Administered 2021-03-02: 1 mg via INTRAVENOUS

## 2021-03-02 MED ORDER — FENTANYL CITRATE (PF) 100 MCG/2ML IJ SOLN
INTRAMUSCULAR | Status: DC | PRN
  Administered 2021-03-02: 25 ug via INTRAVENOUS

## 2021-03-02 MED ORDER — VERAPAMIL HCL 2.5 MG/ML IV SOLN
INTRAVENOUS | Status: AC
  Filled 2021-03-02: qty 2

## 2021-03-02 MED ORDER — MIDAZOLAM HCL 2 MG/2ML IJ SOLN
INTRAMUSCULAR | Status: AC
  Filled 2021-03-02: qty 2

## 2021-03-02 MED ORDER — HEPARIN (PORCINE) IN NACL 1000-0.9 UT/500ML-% IV SOLN
INTRAVENOUS | Status: AC
  Filled 2021-03-02: qty 500

## 2021-03-02 MED ORDER — SODIUM CHLORIDE 0.9 % WEIGHT BASED INFUSION: 1.0000 mL/kg/h | INTRAVENOUS | Status: DC

## 2021-03-02 MED ORDER — HYDRALAZINE HCL 20 MG/ML IJ SOLN: 10.0000 mg | INTRAMUSCULAR | Status: DC | PRN

## 2021-03-02 MED ORDER — LIDOCAINE HCL (PF) 1 % IJ SOLN
INTRAMUSCULAR | Status: AC
  Filled 2021-03-02: qty 30

## 2021-03-02 MED ORDER — LIDOCAINE HCL (PF) 1 % IJ SOLN
INTRAMUSCULAR | Status: DC | PRN
  Administered 2021-03-02 (×2): 4 mL via SUBCUTANEOUS

## 2021-03-02 SURGICAL SUPPLY — 13 items
CATH 5FR JL3.5 JR4 ANG PIG MP (CATHETERS) ×1 IMPLANT
CATH BALLN WEDGE 5F 110CM (CATHETERS) ×1 IMPLANT
CATH INFINITI 5FR MPB2 (CATHETERS) ×1 IMPLANT
DEVICE RAD COMP TR BAND LRG (VASCULAR PRODUCTS) ×1 IMPLANT
GLIDESHEATH SLEND A-KIT 6F 22G (SHEATH) ×1 IMPLANT
GUIDEWIRE INQWIRE 1.5J.035X260 (WIRE) IMPLANT
INQWIRE 1.5J .035X260CM (WIRE) ×2
KIT HEART LEFT (KITS) ×2 IMPLANT
PACK CARDIAC CATHETERIZATION (CUSTOM PROCEDURE TRAY) ×2 IMPLANT
SHEATH GLIDE SLENDER 4/5FR (SHEATH) ×1 IMPLANT
SHEATH PROBE COVER 6X72 (BAG) ×1 IMPLANT
TRANSDUCER W/STOPCOCK (MISCELLANEOUS) ×2 IMPLANT
TUBING CIL FLEX 10 FLL-RA (TUBING) ×2 IMPLANT

## 2021-03-02 NOTE — Research (Signed)
Identify Informed Consent   Subject Name: Anthony [name]  Subject met inclusion and exclusion criteria.  The informed consent form, study requirements and expectations were reviewed with the subject and questions and concerns were addressed prior to the signing of the consent form.  The subject verbalized understanding of the trial requirements.  The subject agreed to participate in the Identify trial and signed the informed consent at 0635 on 02-Mar-2021.  The informed consent was obtained prior to performance of any protocol-specific procedures for the subject.  A copy of the signed informed consent was given to the subject and a copy was placed in the subject's medical record.   Jasmine Pang, RN BSN [facility] Cardiovascular Research & Education Direct Line: [phone]

## 2021-03-02 NOTE — CV Procedure (Signed)
Angiographically normal coronary arteries. Normal right and left heart pressures.

## 2021-03-02 NOTE — Interval H&P Note (Signed)
Cath Lab Visit (complete for each Cath Lab visit)  Clinical Evaluation Leading to the Procedure:   ACS: Yes.    Non-ACS:    Anginal Classification: CCS III  Anti-ischemic medical therapy: Minimal Therapy (1 class of medications)  Non-Invasive Test Results: Low-risk stress test findings: cardiac mortality <1%/year  Prior CABG: No previous CABG      History and Physical Interval Note:  03/02/2021 7:42 AM  Anthony [name]  has presented today for surgery, with the diagnosis of chest pain.  The various methods of treatment have been discussed with the patient and family. After consideration of risks, benefits and other options for treatment, the patient has consented to  Procedure(s): RIGHT/LEFT HEART CATH AND CORONARY ANGIOGRAPHY (N/A) as a surgical intervention.  The patient's history has been reviewed, patient examined, no change in status, stable for surgery.  I have reviewed the patient's chart and labs.  Questions were answered to the patient's satisfaction.     Lyn Records III

## 2021-03-03 MED FILL — Heparin Sod (Porcine)-NaCl IV Soln 1000 Unit/500ML-0.9%: INTRAVENOUS | Qty: 1000 | Status: AC

## 2021-03-04 ENCOUNTER — Other Ambulatory Visit: Payer: Self-pay

## 2021-03-04 ENCOUNTER — Ambulatory Visit (HOSPITAL_COMMUNITY): Payer: 59 | Attending: Cardiology

## 2021-03-04 DIAGNOSIS — R072 Precordial pain: Secondary | ICD-10-CM | POA: Diagnosis present

## 2021-03-04 LAB — ECHOCARDIOGRAM COMPLETE
Area-P 1/2: 3.54 cm2
S' Lateral: 3.3 cm

## 2021-03-04 NOTE — Telephone Encounter (Signed)
Per Dr. Katrinka Blazing- no cardiac reason to complete forms.  Returned to medical records.

## 2021-03-04 NOTE — Telephone Encounter (Signed)
Received folder back stating that the paper work won't be completed & patient is aware. Patient needs to be refunded. JMM 03/04/2021

## 2021-03-11 ENCOUNTER — Encounter: Payer: Self-pay | Admitting: Internal Medicine

## 2021-03-11 ENCOUNTER — Other Ambulatory Visit: Payer: Self-pay

## 2021-03-11 ENCOUNTER — Ambulatory Visit: Payer: 59 | Admitting: Internal Medicine

## 2021-03-11 DIAGNOSIS — R0609 Other forms of dyspnea: Secondary | ICD-10-CM | POA: Diagnosis not present

## 2021-03-11 MED ORDER — PANTOPRAZOLE SODIUM 40 MG PO TBEC: 40.0000 mg | DELAYED_RELEASE_TABLET | Freq: Every day | ORAL | 2 refills | Status: DC

## 2021-03-11 MED ORDER — FAMOTIDINE 20 MG PO TABS: ORAL_TABLET | ORAL | 11 refills | Status: DC

## 2021-03-11 NOTE — Progress Notes (Signed)
Anthony [name], male    DOB: 31-Dec-1960     MRN: [id number]   Brief patient profile:  60 yo  quit smoking 1984  referred to pulmonary clinic 03/11/2021 by Dr  Wyline Mood for copd eval.   Onset of wheezing 2018 p working [employer] in [employer] - twice had CO issues hosp x 2  around [employer].   History of Present Illness  03/11/2021  Pulmonary/ 1st office eval/[name]  Chief Complaint  Patient presents with   Consult    Patient is here to see if he has COPD, states he was told he does but was never given any recommendations. Patient has shortness of breath with exertion. States he uses his inhaler before going to bed and usually wakes up in the middle of the night to have to use it again. Patient's wife states that he snores a lot at night. Cough since August  Dyspnea:  progressive doe walking across parking lot x 2 months  Cough: dry hack came on same time  worse several hours p lie down / 4h after albuterol  Sleep: flat / 2 pillows - recliner 30 degrees  SABA use: every 4 hours  June 1st  2022 had covid p vax x 2  Episode of cp dx gerd 2005   No obvious day to day or daytime variability or assoc excess/ purulent sputum or mucus plugs or hemoptysis or  subjective wheeze or overt sinus or hb symptoms.    . Also denies any obvious fluctuation of symptoms with weather or environmental changes or other aggravating or alleviating factors except as outlined above   No unusual exposure hx or h/o childhood pna/ asthma or knowledge of premature birth.  Current Allergies, Complete Past Medical History, Past Surgical History, Family History, and Social History were reviewed in [employer] record.  ROS  The following are not active complaints unless bolded Hoarseness, sore throat, dysphagia, dental problems, itching, sneezing,  nasal congestion or discharge of excess mucus or purulent secretions, ear ache,   fever, chills, sweats, unintended wt loss or wt gain,  classically pleuritic or exertional cp,  orthopnea pnd or arm/hand swelling  or leg swelling, presyncope, palpitations, abdominal pain, anorexia, nausea, vomiting, diarrhea  or change in bowel habits or change in bladder habits, change in stools or change in urine, dysuria, hematuria,  rash, arthralgias, visual complaints, headache, numbness, weakness or ataxia or problems with walking or coordination,  change in mood or  memory.           Past Medical History:  Diagnosis Date   COPD (chronic obstructive pulmonary disease) (HCC)     Outpatient Medications Prior to Visit  Medication Sig Dispense Refill   albuterol (VENTOLIN HFA) 108 (90 Base) MCG/ACT inhaler Inhale 2 puffs into the lungs every 4 (four) hours as needed for wheezing or shortness of breath. 1 each 0   aspirin EC 81 MG tablet Take 1 tablet (81 mg total) by mouth daily. Swallow whole. 90 tablet 3   Multiple Vitamin (MULTIVITAMIN) tablet Take 1 tablet by mouth daily.     No facility-administered medications prior to visit.     Objective:     BP 120/80 (BP Location: Left Arm, Patient Position: Sitting, Cuff Size: Normal)   Pulse 85   Temp 98 F (36.7 C) (Oral)   Ht 6' (1.829 m)   Wt 231 lb 6.4 oz (105 kg)   SpO2 95%   BMI 31.38 kg/m   SpO2: 95 %  HEENT : pt wearing mask not removed for exam due to covid -19 concerns.    NECK :  without JVD/Nodes/TM/ nl carotid upstrokes bilaterally   LUNGS: no acc muscle use,  Nl contour chest which is clear to A and P bilaterally without cough on insp or exp maneuvers   CV:  RRR  no s3 or murmur or increase in P2, and no edema   ABD:  soft and nontender with nl inspiratory excursion in the supine position. No bruits or organomegaly appreciated, bowel sounds nl  MS:  Nl gait/ ext warm without deformities, calf tenderness, cyanosis or clubbing No obvious joint restrictions   SKIN: warm and dry without lesions    NEURO:  alert, approp, nl sensorium with  no motor or  cerebellar deficits apparent.    CTa 02/17/21  1. No CT evidence of pulmonary embolism. 2. Mild left upper lobe and bibasilar atelectasis.      Assessment   DOE (dyspnea on exertion) Onset 2018 assoc with wheeze in pt with prior atypical cp in 2005 attributed to GERD  CTa 02/17/21  1. No CT evidence of pulmonary embolism. 2. Mild left upper lobe and bibasilar atelectasis. LHC  03/02/21 nl coronaries, nl LVEDP Echo 03/04/21  Nl  -  03/11/2021   Walked on RA x  3  lap(s) =  approx 750 @ mod to fast pace, stopped due to end of study, min sob  with lowest 02 sats 98%   Symptoms are markedly disproportionate to objective findings and not clear to what extent this is actually a pulmonary  problem but pt does appear to have difficult to sort out respiratory symptoms of unknown origin for which  DDX  = almost all start with A and  include Adherence, Ace Inhibitors, Acid Reflux, Active Sinus Disease, Alpha 1 Antitripsin deficiency, Anxiety masquerading as Airways dz,  ABPA,  Allergy(esp in young), Aspiration (esp in elderly), Adverse effects of meds,  Active smoking or Vaping, A bunch of PE's/clot burden (a few small clots can't cause this syndrome unless there is already severe underlying pulm or vascular dz with poor reserve),  Anemia or thyroid disorder, plus two Bs  = Bronchiectasis and Beta blocker use..and one C= CHF     Adherence is always the initial "prime suspect" and is a multilayered concern that requires a "trust but verify" approach in every patient - starting with knowing how to use medications, especially inhalers, correctly, keeping up with refills and understanding the fundamental difference between maintenance and prns vs those medications only taken for a very short course and then stopped and not refilled.  - - The proper method of use, as well as anticipated side effects, of a metered-dose inhaler were discussed and demonstrated to the patient using teach back method.  - Re SABA :  I  spent extra time with pt today reviewing appropriate use of albuterol for prn use on exertion with the following points: 1) saba is for relief of sob that does not improve by walking a slower pace or resting but rather if the pt does not improve after trying this first. 2) If the pt is convinced, as many are, that saba helps recover from activity faster then it's easy to tell if this is the case by re-challenging : ie stop, take the inhaler, then p 5 minutes try the exact same activity (intensity of workload) that just caused the symptoms and see if they are substantially diminished or not after saba 3) if  there is an activity that reproducibly causes the symptoms, try the saba 15 min before the activity on alternate days   If in fact the saba really does help, then fine to continue to use it prn but advised may need to look closer at the maintenance regimen being used to achieve better control of airways disease with exertion.   ? Acid (or non-acid) GERD > always difficult to exclude as up to 75% of pts in some series report no assoc GI/ Heartburn symptoms> rec max (24h)  acid suppression and diet restrictions/ reviewed and instructions given in writing.   ? Adverse drug effects > none of the usual suspects listed   ? Anxiety/depression/deconditioning  > usually at the bottom of this list of usual suspects  But may interfere with adherence and also interpretation of response or lack thereof to symptom management which can be quite subjective.   Anemia/ thyroid dz Lab Results  Component Value Date   HGB 14.3 03/02/2021   HGB 14.3 03/02/2021   HGB 15.1 02/17/2021  TSH needed if not done by PCP.  A bunch of pe's/ chf already excluded by recent w/u above.  >>>  Sub max ex x 2 weeks with max rx for gerd then proceed to cpst.   Discussed in detail all the  indications, usual  risks and alternatives  relative to the benefits with patient who agrees to proceed with w/u as outlined.             Each maintenance medication was reviewed in detail including emphasizing most importantly the difference between maintenance and prns and under what circumstances the prns are to be triggered using an action plan format where appropriate.  Total time for H and P, chart review, counseling, reviewing hfa device(s) , directly observing portions of ambulatory 02 saturation study/ and generating customized AVS unique to this office visit / same day charting = 52 min          Sandrea Hughs, MD 03/11/2021

## 2021-03-11 NOTE — Patient Instructions (Addendum)
To get the most out of exercise, you need to be continuously aware that you are short of breath, but never out of breath, for at least 30 minutes daily. As you improve, it will actually be easier for you to do the same amount of exercise  in  30 minutes so always push to the level where you are short of breath.  Once you can do this, push for longer duration or repeat it after at least 4 hours of rest.  Make sure you check your oxygen saturations at highest level of activity to be sure your 02 sats are over 90%  Pantoprazole (protonix) 40 mg   Take  30-60 min before first meal of the day and Pepcid (famotidine)  20 mg after supper until return to office - this is the best way to tell whether stomach acid is contributing to your problem.    If no improving after 2 weeks, call to schedule cpst   GERD (REFLUX)  is an extremely common cause of respiratory symptoms just like yours , many times with no obvious heartburn at all.    It can be treated with medication, but also with lifestyle changes including elevation of the head of your bed (ideally with 6 -8inch blocks under the headboard of your bed),  Smoking cessation, avoidance of late meals, excessive alcohol, and avoid fatty foods, chocolate, peppermint, colas, red wine, and acidic juices such as orange juice.  NO MINT OR MENTHOL PRODUCTS SO NO COUGH DROPS  USE SUGARLESS CANDY INSTEAD (Jolley ranchers or Stover's or Life Savers) or even ice chips will also do - the key is to swallow to prevent all throat clearing. NO OIL BASED VITAMINS - use powdered substitutes.  Avoid fish oil when coughing.   Only use your albuterol as a rescue medication to be used if you can't catch your breath by resting or doing a relaxed purse lip breathing pattern.  - The less you use it, the better it will work when you need it. - Ok to use up to 2 puffs  every 4 hours if you must but call for immediate appointment if use goes up over your usual need - Don't leave home  without it !!  (think of it like the spare tire for your car)   Ok to try albuterol 15 min before an activity (on alternating days)  that you know would usually make you short of breath and see if it makes any difference and if makes none then don't take albuterol after activity unless you can't catch your breath as this means it's the resting that helps, not the albuterol.  Work on inhaler technique:  relax and gently blow all the way out then take a nice smooth full deep breath back in, triggering the inhaler at same time you start breathing in.    Blow out thru nose. Rinse and gargle with water when done.  If mouth or throat bother you at all,  try brushing teeth/gums/tongue with arm and hammer toothpaste/ make a slurry and gargle and spit out.       Refer to sleep medicine next available

## 2021-03-11 NOTE — Assessment & Plan Note (Signed)
Onset 2018 assoc with wheeze in pt with prior atypical cp in 2005 attributed to GERD  CTa 02/17/21  1. No CT evidence of pulmonary embolism. 2. Mild left upper lobe and bibasilar atelectasis. LHC  03/02/21 nl coronaries, nl LVEDP Echo 03/04/21  Nl  -  03/11/2021   Walked on RA x  3  lap(s) =  approx 750 @ mod to fast pace, stopped due to end of study, min sob  with lowest 02 sats 98%   Symptoms are markedly disproportionate to objective findings and not clear to what extent this is actually a pulmonary  problem but pt does appear to have difficult to sort out respiratory symptoms of unknown origin for which  DDX  = almost all start with A and  include Adherence, Ace Inhibitors, Acid Reflux, Active Sinus Disease, Alpha 1 Antitripsin deficiency, Anxiety masquerading as Airways dz,  ABPA,  Allergy(esp in young), Aspiration (esp in elderly), Adverse effects of meds,  Active smoking or Vaping, A bunch of PE's/clot burden (a few small clots can't cause this syndrome unless there is already severe underlying pulm or vascular dz with poor reserve),  Anemia or thyroid disorder, plus two Bs  = Bronchiectasis and Beta blocker use..and one C= CHF     Adherence is always the initial "prime suspect" and is a multilayered concern that requires a "trust but verify" approach in every patient - starting with knowing how to use medications, especially inhalers, correctly, keeping up with refills and understanding the fundamental difference between maintenance and prns vs those medications only taken for a very short course and then stopped and not refilled.  - - The proper method of use, as well as anticipated side effects, of a metered-dose inhaler were discussed and demonstrated to the patient using teach back method.  - Re SABA :  I spent extra time with pt today reviewing appropriate use of albuterol for prn use on exertion with the following points: 1) saba is for relief of sob that does not improve by walking a slower  pace or resting but rather if the pt does not improve after trying this first. 2) If the pt is convinced, as many are, that saba helps recover from activity faster then it's easy to tell if this is the case by re-challenging : ie stop, take the inhaler, then p 5 minutes try the exact same activity (intensity of workload) that just caused the symptoms and see if they are substantially diminished or not after saba 3) if there is an activity that reproducibly causes the symptoms, try the saba 15 min before the activity on alternate days   If in fact the saba really does help, then fine to continue to use it prn but advised may need to look closer at the maintenance regimen being used to achieve better control of airways disease with exertion.   ? Acid (or non-acid) GERD > always difficult to exclude as up to 75% of pts in some series report no assoc GI/ Heartburn symptoms> rec max (24h)  acid suppression and diet restrictions/ reviewed and instructions given in writing.   ? Adverse drug effects > none of the usual suspects listed   ? Anxiety/depression/deconditioning  > usually at the bottom of this list of usual suspects  But may interfere with adherence and also interpretation of response or lack thereof to symptom management which can be quite subjective.   Anemia/ thyroid dz Lab Results  Component Value Date   HGB 14.3 03/02/2021  HGB 14.3 03/02/2021   HGB 15.1 02/17/2021  TSH needed if not done by PCP.  A bunch of pe's/ chf already excluded by recent w/u above.  >>>  Sub max ex x 2 weeks with max rx for gerd then proceed to cpst.   Discussed in detail all the  indications, usual  risks and alternatives  relative to the benefits with patient who agrees to proceed with w/u as outlined.            Each maintenance medication was reviewed in detail including emphasizing most importantly the difference between maintenance and prns and under what circumstances the prns are to be triggered  using an action plan format where appropriate.  Total time for H and P, chart review, counseling, reviewing hfa device(s) , directly observing portions of ambulatory 02 saturation study/ and generating customized AVS unique to this office visit / same day charting = 52 min

## 2021-03-12 ENCOUNTER — Other Ambulatory Visit (INDEPENDENT_AMBULATORY_CARE_PROVIDER_SITE_OTHER): Payer: 59

## 2021-03-12 DIAGNOSIS — R0609 Other forms of dyspnea: Secondary | ICD-10-CM

## 2021-03-12 LAB — CBC WITH DIFFERENTIAL/PLATELET
Basophils Absolute: 0.2 10*3/uL — ABNORMAL HIGH (ref 0.0–0.1)
Basophils Relative: 2.8 % (ref 0.0–3.0)
Eosinophils Absolute: 1.1 10*3/uL — ABNORMAL HIGH (ref 0.0–0.7)
Eosinophils Relative: 18 % — ABNORMAL HIGH (ref 0.0–5.0)
HCT: 43.7 % (ref 39.0–52.0)
Hemoglobin: 14.8 g/dL (ref 13.0–17.0)
Lymphocytes Relative: 27 % (ref 12.0–46.0)
Lymphs Abs: 1.6 10*3/uL (ref 0.7–4.0)
MCHC: 33.9 g/dL (ref 30.0–36.0)
MCV: 93.7 fl (ref 78.0–100.0)
Monocytes Absolute: 0.6 10*3/uL (ref 0.1–1.0)
Monocytes Relative: 10.3 % (ref 3.0–12.0)
Neutro Abs: 2.5 10*3/uL (ref 1.4–7.7)
Neutrophils Relative %: 41.9 % — ABNORMAL LOW (ref 43.0–77.0)
Platelets: 156 10*3/uL (ref 150.0–400.0)
RBC: 4.66 Mil/uL (ref 4.22–5.81)
RDW: 12.8 % (ref 11.5–15.5)
WBC: 6 10*3/uL (ref 4.0–10.5)

## 2021-03-12 LAB — TSH: TSH: 2.75 u[IU]/mL (ref 0.35–5.50)

## 2021-03-15 LAB — IGE: IgE (Immunoglobulin E), Serum: 249 kU/L — ABNORMAL HIGH (ref <=114)

## 2021-03-15 NOTE — Telephone Encounter (Signed)
Received the following message from patient:   "Do I stay out of work till the 2 weeks is up or return with restrictions. I work at [employer] and just walking to front door is 1/4 mile from parking lot, the handicap parking spaces and another 1/4 mile to get to where I work. Where I work on a [employer] and do a lot of walking. I am still coughing a lot. My Primary Care Doctor, Dr Wyline Mood has me returning to work on 10/17. Two weeks would be on the 20th please clear this up for me  thank you [name] "  I looked at his last OV and did not see any mentioning of him remaining out of work, nor did I see a work letter from [employer]. MW, can you please advise if you remember talking to him about being out of work? Thanks!

## 2021-03-17 ENCOUNTER — Telehealth: Payer: Self-pay | Admitting: Internal Medicine

## 2021-03-17 MED ORDER — BUDESONIDE-FORMOTEROL FUMARATE 80-4.5 MCG/ACT IN AERO: 2.0000 | INHALATION_SPRAY | Freq: Two times a day (BID) | RESPIRATORY_TRACT | 6 refills | Status: DC

## 2021-03-17 NOTE — Telephone Encounter (Signed)
Call patient :  Studies suggest significant allergic component that may be driving his symptoms so rec maint rx trial of symbicort 80 Take 2 puffs first thing in am and then another 2 puffs about 12 hours later and continue to supplement with albuterol up to q4 h prn and return p on month  f/u ov so we can go over all the details of this study and get a plan together moving forward - ok to move up f/u if not feeling better and wants to be seen sooner   I have called the pt and he is aware of results.  Symbicort has been sent to the pharmacy and I have scheduled the pt to see MW in the [facility] office on 11/23 at 915.  Pt is aware.

## 2021-04-28 ENCOUNTER — Other Ambulatory Visit: Payer: Self-pay

## 2021-04-28 ENCOUNTER — Encounter: Payer: Self-pay | Admitting: Internal Medicine

## 2021-04-28 ENCOUNTER — Ambulatory Visit: Payer: 59 | Admitting: Internal Medicine

## 2021-04-28 DIAGNOSIS — R0609 Other forms of dyspnea: Secondary | ICD-10-CM

## 2021-04-28 NOTE — Progress Notes (Signed)
Anthony [name], male    DOB: [birth year]     MRN: [id number]   Brief patient profile:  59 yowm  quit smoking 1984  referred to pulmonary clinic 03/11/2021 by Dr Huel Cote for copd eval.   Onset of wheezing 2018 p working [place] in [employer] - twice had [place] issues hosp x 2  due to [employer]   History of Present Illness  03/11/2021  Pulmonary/ 1st [name] eval/Anthony [name]  Chief Complaint  Patient presents with   Consult    Patient is here to see if he has COPD, states he was told he does but was never given any recommendations. Patient has shortness of breath with exertion. States he uses his inhaler before going to bed and usually wakes up in the middle of the night to have to use it again. Patient's wife states that he snores a lot at night. Cough since August  Dyspnea:  progressive doe walking across parking lot x 2 months  Cough: dry hack came on same time  worse several hours p lie down / 4h after albuterol  Sleep: flat / 2 pillows - recliner 30 degrees  SABA use: every 4 hours  June 1st  2022 had covid p vax x 2  Episode of cp dx gerd 2005  Rec To get the most out of exercise, you need to be continuously aware that you are short of breath,   Make sure you check your oxygen saturations at highest level of activity to be sure your 02 sats are over 90% Pantoprazole (protonix) 40 mg   Take  30-60 min before first meal of the day and Pepcid (famotidine)  20 mg after supper until return to [name] - this is the best way to tell whether stomach acid is contributing to your problem.   If no improving after 2 weeks, call to schedule cpst  GERD diet reviewed, bed blocks rec  Only use your albuterol as a rescue medication  Ok to try albuterol 15 min before an activity (on alternating days)  that you know would usually make you short of breath  Work on inhaler technique:   Refer to sleep medicine next available    04/28/2021  f/u ov/Anthony [name]/Anthony [name] re: doe  maint on symbicort  80 2bid and gerd rx   Chief Complaint  Patient presents with   Follow-up    Breathing has improved a little bit per patient   Dyspnea: walking to brother's house now s needing albuterol  Treadmill x 20 min at 3lpm almost level  Cough: better until caught cold a week prior to [name]: seeing sleep medicine Dec 7 SABA use: sev times a day but never before ex  02: none  Covid status: 2 vax / June 2022 omicrorn infected      No obvious day to day or daytime variability or assoc excess/ purulent sputum or mucus plugs or hemoptysis or cp or chest tightness, subjective wheeze or overt sinus or hb symptoms.     Also denies any obvious fluctuation of symptoms with weather or environmental changes or other aggravating or alleviating factors except as outlined above   No unusual exposure hx or h/o childhood pna/ asthma or knowledge of premature birth.  Current Allergies, Complete Past Medical History, Past Surgical History, Family History, and Social History were reviewed in [facility] record.  ROS  The following are not active complaints unless bolded Hoarseness, sore throat, dysphagia, dental problems, itching, sneezing,  nasal congestion or discharge of excess mucus or purulent secretions, ear ache,   fever, chills, sweats, unintended wt loss or wt gain, classically pleuritic or exertional cp,  orthopnea pnd or arm/hand swelling  or leg swelling, presyncope, palpitations, abdominal pain, anorexia, nausea, vomiting, diarrhea  or change in bowel habits or change in bladder habits, change in stools or change in urine, dysuria, hematuria,  rash, arthralgias, visual complaints, headache, numbness, weakness or ataxia or problems with walking or coordination,  change in mood or  memory.        Current Meds  Medication Sig   albuterol (VENTOLIN HFA) 108 (90 Base) MCG/ACT inhaler Inhale 2 puffs into the lungs every 4 (four) hours as needed for wheezing or shortness of  breath.   aspirin EC 81 MG tablet Take 1 tablet (81 mg total) by mouth daily. Swallow whole.   budesonide-formoterol (SYMBICORT) 80-4.5 MCG/ACT inhaler Inhale 2 puffs into the lungs 2 (two) times daily.   famotidine (PEPCID) 20 MG tablet One after supper   Multiple Vitamin (MULTIVITAMIN) tablet Take 1 tablet by mouth daily.   pantoprazole (PROTONIX) 40 MG tablet Take 1 tablet (40 mg total) by mouth daily. Take 30-60 min before first meal of the day         Past Medical History:  Diagnosis Date   COPD (chronic obstructive pulmonary disease) (HCC)     Objective:       Wt Readings from Last 3 Encounters:  04/28/21 233 lb 0.6 oz (105.7 kg)  03/11/21 231 lb 6.4 oz (105 kg)  03/02/21 226 lb (102.5 kg)      Vital signs reviewed  04/28/2021  - Note at rest 02 sats  99% on RA   General appearance:    amb wm / nasal tone to voice    HEENT : pt wearing mask not removed for exam due to covid -19 concerns.    NECK :  without JVD/Nodes/TM/ nl carotid upstrokes bilaterally   LUNGS: no acc muscle use,  Nl contour chest which is clear to A and P bilaterally without cough on insp or exp maneuvers   CV:  RRR  no s3 or murmur or increase in P2, and no edema   ABD:  soft and nontender with nl inspiratory excursion in the supine position. No bruits or organomegaly appreciated, bowel sounds nl  MS:  Nl gait/ ext warm without deformities, calf tenderness, cyanosis or clubbing No obvious joint restrictions   SKIN: warm and dry without lesions    NEURO:  alert, approp, nl sensorium with  no motor or cerebellar deficits apparent.       Assessment

## 2021-04-28 NOTE — Patient Instructions (Signed)
Ok to try albuterol 15 min before an activity (on alternating days)  that you know would usually make you short of breath and see if it makes any difference and if makes none then don't take albuterol after activity unless you can't catch your breath as this means it's the resting that helps, not the albuterol.     Please schedule a follow up visit in 3 months  with PFTs

## 2021-04-29 ENCOUNTER — Encounter: Payer: Self-pay | Admitting: Internal Medicine

## 2021-04-29 NOTE — Assessment & Plan Note (Signed)
Onset 2018 assoc with wheeze in pt with prior atypical cp in 2005 attributed to GERD  CTa 02/17/21  1. No CT evidence of pulmonary embolism. 2. Mild left upper lobe and bibasilar atelectasis. LHC  03/02/21 nl coronaries, nl LVEDP Echo 03/04/21  Nl  -  03/11/2021   Walked on RA x  3  lap(s) =  approx 750 @ mod to fast pace, stopped due to end of study, min sob  with lowest 02 sats 98%  - Allergy profile   03/12/21 >  Eos 1.1  /  IgE  249 > trial of symbicort 80 2bid rec 03/15/2021 >>> improved 04/28/2021  - 04/28/2021  After extensive coaching inhaler device,  effectiveness =    90%  - 04/28/2021   Walked on RA x  3  lap(s) =  approx 450 @ moderate pace, stopped due to end of study s sob  with lowest 02 sats 99 %  Improving on gerd rx and symbicort with main finding at this point deconditioning so needs to continue present rx and f/u with PFTs to see if residual airflow obst in which case adding LAMA is next step  Each maintenance medication was reviewed in detail including emphasizing most importantly the difference between maintenance and prns and under what circumstances the prns are to be triggered using an action plan format where appropriate.  Total time for H and P, chart review, counseling,  , directly observing portions of ambulatory 02 saturation study/ and generating customized AVS unique to this office visit / same day charting = 22 min

## 2021-05-12 ENCOUNTER — Encounter: Payer: Self-pay | Admitting: Pulmonary Disease

## 2021-05-12 ENCOUNTER — Ambulatory Visit: Payer: 59 | Admitting: Pulmonary Disease

## 2021-05-12 ENCOUNTER — Other Ambulatory Visit: Payer: Self-pay

## 2021-05-12 VITALS — BP 128/88 | HR 82 | Temp 98.0°F | Ht 72.0 in | Wt 238.1 lb

## 2021-05-12 DIAGNOSIS — R0609 Other forms of dyspnea: Secondary | ICD-10-CM

## 2021-05-12 DIAGNOSIS — R0683 Snoring: Secondary | ICD-10-CM | POA: Insufficient documentation

## 2021-05-12 NOTE — Patient Instructions (Signed)
Home sleep study 

## 2021-05-12 NOTE — Assessment & Plan Note (Addendum)
Await PFTs, currently being treated as asthma, Symbicort seems to be working and his episodes of chest tightness have subsided

## 2021-05-12 NOTE — Progress Notes (Signed)
Subjective:    Patient ID: Anthony [name], male    DOB: 07/06/1960, 60 y.o.   MRN: [id number]  HPI  Chief Complaint  Patient presents with   Consult    Sleep consult  Suspected sleep apnea. Per patient wife says he snores sometimes and wakes up between 2-3 in the morning and has a hard time falling back asleep.  Patient works swing shifts.    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 Bed partner history is not available but his wife has reportedly heard loud snoring and witnessed apneas and asked him to seek evaluation.  He works a swing shift and changes every couple of weeks, 12-hour shifts in a 2-2-3 pattern.  He has been doing this since 2016 and prior to that worked the first shift for the last 20 years.  He is always an early riser. Bedtime can be 9 PM with wake-up time at 4 AM and when he works the night shift this can be 9 AM with a wake up anywhere between noon and 4 PM. Sleep latency is minimal he sleeps on his right side with 1-2 pillows, reports 3-4 awakenings including nocturia and is out of bed with dryness of mouth but denies headaches. Epworth sleepiness score is 5. Weight is fluctuated between 225 and 230 pounds.  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  He underwent evaluation for chest tightness and wheezing, episodic, including ED visit in 01/2021 for bronchospasm and chest tightness. Cardiac evaluation showed coronary calcification but no significant CAD on left heart cath.  PFTs have been scheduled, Symbicort has helped    Significant tests/ events reviewed  - Allergy profile   03/12/21 >  Eos 1.1  /  IgE  249 > trial of symbicort 80 2bid rec 03/15/2021 >>> improved   CTA chest 02/2021 neg, LUL minimal atelectasis  CT cors 03/2021  Ca score 243, 82 %ile LHC  03/02/21 nl coronaries, nl LVEDP Echo 03/04/21  Nl    Past Medical History:  Diagnosis Date    COPD (chronic obstructive pulmonary disease) (HCC)    Past Surgical History:  Procedure Laterality Date   RIGHT/LEFT HEART CATH AND CORONARY ANGIOGRAPHY N/A 03/02/2021   Procedure: RIGHT/LEFT HEART CATH AND CORONARY ANGIOGRAPHY;  Surgeon: Lyn Records, MD;  Location: [facility];  Service: Cardiovascular;  Laterality: N/A;    Allergies  Allergen Reactions   Morphine Other (See Comments)    Lowers blood pressure   Pentazocine     Drops blood pressure    .soc  Family History  Problem Relation Age of Onset   Asthma Mother    Cancer Mother    Heart disease Father        Review of Systems  Constitutional: negative for anorexia, fevers and sweats  Eyes: negative for irritation, redness and visual disturbance  Ears, nose, mouth, throat, and face: negative for earaches, epistaxis, nasal congestion and sore throat  Respiratory: negative for cough, dyspnea on exertion, sputum and wheezing  Cardiovascular: negative for chest pain, dyspnea, lower extremity edema, orthopnea, palpitations and syncope  Gastrointestinal: negative for abdominal pain, constipation, diarrhea, melena, nausea and vomiting  Genitourinary:negative for dysuria, frequency and hematuria  Hematologic/lymphatic: negative for bleeding, easy bruising and lymphadenopathy  Musculoskeletal:negative for arthralgias, muscle weakness and stiff joints  Neurological: negative for coordination problems, gait problems, headaches and weakness  Endocrine: negative for diabetic symptoms  including polydipsia, polyuria and weight loss     Objective:   Physical Exam  Gen. Pleasant, obese, in no distress, normal affect ENT - no pallor,icterus, no post nasal drip, class 2-3 airway Neck: No JVD, no thyromegaly, no carotid bruits Lungs: no use of accessory muscles, no dullness to percussion, decreased without rales or rhonchi  Cardiovascular: Rhythm regular, heart sounds  normal, no murmurs or gallops, no peripheral  edema Abdomen: soft and non-tender, no hepatosplenomegaly, BS normal. Musculoskeletal: No deformities, no cyanosis or clubbing Neuro:  alert, non focal, no tremors       Assessment & Plan:

## 2021-05-12 NOTE — Assessment & Plan Note (Signed)
Given excessive daytime somnolence, narrow pharyngeal exam, witnessed apneas & loud snoring, obstructive sleep apnea is very likely & an overnight polysomnogram will be scheduled as a home study. The pathophysiology of obstructive sleep apnea , it's cardiovascular consequences & modes of treatment including CPAP were discused with the patient in detail & they evidenced understanding.  Pretest probability is intermediate.  Differential diagnosis here is circadian rhythm disorder based on his working swing shifts

## 2021-06-18 ENCOUNTER — Other Ambulatory Visit: Payer: Self-pay | Admitting: Internal Medicine

## 2021-07-16 ENCOUNTER — Other Ambulatory Visit: Payer: Self-pay

## 2021-07-16 DIAGNOSIS — R0609 Other forms of dyspnea: Secondary | ICD-10-CM

## 2021-07-19 ENCOUNTER — Encounter: Payer: Self-pay | Admitting: Internal Medicine

## 2021-07-19 ENCOUNTER — Ambulatory Visit: Payer: 59 | Admitting: Internal Medicine

## 2021-07-19 ENCOUNTER — Other Ambulatory Visit: Payer: Self-pay

## 2021-07-19 DIAGNOSIS — R0609 Other forms of dyspnea: Secondary | ICD-10-CM | POA: Diagnosis not present

## 2021-07-19 NOTE — Assessment & Plan Note (Addendum)
Onset 2018 assoc with wheeze in pt with prior atypical cp in 2005 attributed to GERD  CTa 02/17/21  1. No CT evidence of pulmonary embolism. 2. Mild left upper lobe and bibasilar atelectasis. LHC  03/02/21 nl coronaries, nl LVEDP Echo 03/04/21  Nl  -  03/11/2021   Walked on RA x  3  lap(s) =  approx 750 @ mod to fast pace, stopped due to end of study, min sob  with lowest 02 sats 98%  - Allergy profile   03/12/21 >  Eos 1.1  /  IgE  249 > trial of symbicort 80 2bid rec 03/15/2021 >>> improved 04/28/2021  - 04/28/2021  After extensive coaching inhaler device,  effectiveness =    90%  - 04/28/2021   Walked on RA x  3  lap(s) =  approx 450 @ moderate pace, stopped due to end of study s sob  with lowest 02 sats 99 %   All goals of chronic asthma control met including optimal function and elimination of symptoms with minimal need for rescue therapy.  Contingencies discussed in full including contacting this office immediately if not controlling the symptoms using the rule of two's.     Needs pfts to complete the w/u done s symbicort prior/ advised   F/u sleep medicine and here in 1 year, call sooner if needed           Each maintenance medication was reviewed in detail including emphasizing most importantly the difference between maintenance and prns and under what circumstances the prns are to be triggered using an action plan format where appropriate.  Total time for H and P, chart review, counseling, reviewing hfa device(s) and generating customized AVS unique to this office visit / same day charting = 24 min

## 2021-07-19 NOTE — Patient Instructions (Addendum)
We will schedule a PFT next available  - don't use any inhalers prior   Only use your albuterol as a rescue medication to be used if you can't catch your breath by resting or doing a relaxed purse lip breathing pattern.  - The less you use it, the better it will work when you need it. - Ok to use up to 2 puffs  every 4 hours if you must but call for immediate appointment if use goes up over your usual need - Don't leave home without it !!  (think of it like the spare tire for your car)   Ok to try albuterol 15 min before an activity (on alternating days)  that you know would usually make you short of breath and see if it makes any difference and if makes none then don't take albuterol after activity unless you can't catch your breath as this means it's the resting that helps, not the albuterol.      Please schedule a follow up visit in 12  months but call sooner if needed

## 2021-07-19 NOTE — Progress Notes (Signed)
Anthony Anthony [name], male    DOB: 1960/12/26     MRN: [id number]   Brief patient profile:  27  yowm  quit smoking 1984  referred to pulmonary clinic 03/11/2021 by Dr Anthony Anthony [name] for copd eval.   Onset of wheezing 2018 p working Anthony Anthony [name] in [employer] - twice had CO issues hosp x 2  due to [employer]   History of Present Illness  03/11/2021  Pulmonary/ 1st Anthony [name] eval/Anthony Anthony [name]  Chief Complaint  Patient presents with   Consult    Patient is here to see if he has COPD, states he was told he does but was never given any recommendations. Patient has shortness of breath with exertion. States he uses his inhaler before going to bed and usually wakes up in the middle of the night to have to use it again. Patient's wife states that he snores a lot at night. Cough since August  Dyspnea:  progressive doe walking across parking lot x 2 months  Cough: dry hack came on same time  worse several hours p lie down / 4h after albuterol  Sleep: flat / 2 pillows - recliner 30 degrees  SABA use: every 4 hours  June 1st  2022 had covid p vax x 2  Episode of cp dx gerd 2005  Rec To get the most out of exercise, you need to be continuously aware that you are short of breath,   Make sure you check your oxygen saturations at highest level of activity to be sure your 02 sats are over 90% Pantoprazole (protonix) 40 mg   Take  30-60 min before first meal of the day and Pepcid (famotidine)  20 mg after supper until return to Anthony [name] - this is the best way to tell whether stomach acid is contributing to your problem.   If no improving after 2 weeks, call to schedule cpst  GERD diet reviewed, bed blocks rec  Only use your albuterol as a rescue medication  Ok to try albuterol 15 min before an activity (on alternating days)  that you know would usually make you short of breath  Work on inhaler technique:   Refer to sleep medicine next available    04/28/2021  f/u ov/Anthony Anthony [name]/Anthony Anthony [name] re: doe  maint on symbicort  80 2bid and gerd rx   Chief Complaint  Patient presents with   Follow-up    Breathing has improved a little bit per patient   Dyspnea: walking to brother's house now s needing albuterol  Treadmill x 20 min at 3lpm almost level  Cough: better until caught cold a week prior to Anthony Anthony [name]: seeing sleep medicine Dec 7 SABA use: sev times a day but never before ex  02: none  Covid status: 2 vax / June 2022 omicrorn infected  Rec Ok to try albuterol 15 min before an activity (on alternating days)  that you know would usually make you short of breath and see if it makes any difference and if makes none then don't take albuterol after activity unless you can't catch your breath as this means it's the resting that helps, not the albuterol. Please schedule a follow up visit in 3 months  with PFTs    07/19/2021  f/u ov/ Anthony [name]/Anthony Anthony [name] re: doe maint on symb 59 2bid   Chief Complaint  Patient presents with   Follow-up    Breathing has continues to improve since last OV.   Dyspnea:  treadmill 3 -4 per week 20-30 min s  02 above 90% 73mph Cough: none  Sleeping: flat / sev pillows  SABA use: minimal usually p ex  02: none  Covid status: vax x 2/ June omicron 2022      No obvious day to day or daytime variability or assoc excess/ purulent sputum or mucus plugs or hemoptysis or cp or chest tightness, subjective wheeze or overt sinus or hb symptoms.   Sleeping  without nocturnal  or early am exacerbation  of respiratory  c/o's or need for noct saba. Also denies any obvious fluctuation of symptoms with weather or environmental changes or other aggravating or alleviating factors except as outlined above   No unusual exposure hx or h/o childhood pna/ asthma or knowledge of premature birth.  Current Allergies, Complete Past Medical History, Past Surgical History, Family History, and Social History were reviewed in [employer] record.  ROS  The following are not active  complaints unless bolded Hoarseness, sore throat, dysphagia, dental problems, itching, sneezing,  nasal congestion or discharge of excess mucus or purulent secretions, ear ache,   fever, chills, sweats, unintended wt loss or wt gain, classically pleuritic or exertional cp,  orthopnea pnd or arm/hand swelling  or leg swelling, presyncope, palpitations, abdominal pain, anorexia, nausea, vomiting, diarrhea  or change in bowel habits or change in bladder habits, change in stools or change in urine, dysuria, hematuria,  rash, arthralgias, visual complaints, headache, numbness, weakness or ataxia or problems with walking or coordination,  change in mood or  memory.        Current Meds  Medication Sig   albuterol (VENTOLIN HFA) 108 (90 Base) MCG/ACT inhaler Inhale 2 puffs into the lungs every 4 (four) hours as needed for wheezing or shortness of breath.   aspirin EC 81 MG tablet Take 1 tablet (81 mg total) by mouth daily. Swallow whole.   budesonide-formoterol (SYMBICORT) 80-4.5 MCG/ACT inhaler Inhale 2 puffs into the lungs 2 (two) times daily.   famotidine (PEPCID) 20 MG tablet One after supper   Multiple Vitamin (MULTIVITAMIN) tablet Take 1 tablet by mouth daily.   pantoprazole (PROTONIX) 40 MG tablet TAKE 1 TABLET (40 MG TOTAL) BY MOUTH DAILY. TAKE 30-60 MIN BEFORE FIRST MEAL OF THE DAY             Objective:       07/19/2021       241   04/28/21 233 lb 0.6 oz (105.7 kg)  03/11/21 231 lb 6.4 oz (105 kg)  03/02/21 226 lb (102.5 kg)    Vital signs reviewed  07/19/2021  - Note at rest 02 sats  97% on RA   General appearance:    mod obese amb wm nad   HEENT : pt wearing mask not removed for exam due to covid -19 concerns.    NECK :  without JVD/Nodes/TM/ nl carotid upstrokes bilaterally   LUNGS: no acc muscle use,  Nl contour chest which is clear to A and P bilaterally without cough on insp or exp maneuvers   CV:  RRR  no s3 or murmur or increase in P2, and no edema   ABD:  mod obese  but soft and nontender with nl inspiratory excursion in the supine position. No bruits or organomegaly appreciated, bowel sounds nl  MS:  Nl gait/ ext warm without deformities, calf tenderness, cyanosis or clubbing No obvious joint restrictions   SKIN: warm and dry without lesions    NEURO:  alert, approp, nl sensorium with  no motor or  cerebellar deficits apparent.          Assessment

## 2021-07-20 ENCOUNTER — Ambulatory Visit: Payer: 59 | Admitting: Internal Medicine

## 2021-08-19 ENCOUNTER — Ambulatory Visit: Payer: 59

## 2021-08-19 ENCOUNTER — Other Ambulatory Visit: Payer: Self-pay

## 2021-08-19 DIAGNOSIS — G4733 Obstructive sleep apnea (adult) (pediatric): Secondary | ICD-10-CM | POA: Diagnosis not present

## 2021-08-25 DIAGNOSIS — G4733 Obstructive sleep apnea (adult) (pediatric): Secondary | ICD-10-CM | POA: Diagnosis not present

## 2021-08-28 ENCOUNTER — Encounter: Payer: Self-pay | Admitting: Pulmonary Disease

## 2021-08-30 NOTE — Telephone Encounter (Signed)
Patient is asking about HST results.  ?Dr. Vassie Loll please advise if you've had a chance to look over them!  ?Thanks!  ?

## 2021-08-30 NOTE — Telephone Encounter (Signed)
Called and spoke to patient. Went over results with him and he voiced understanding. He states he will call back to schedule a 3 month follow up.  ?

## 2021-09-28 ENCOUNTER — Other Ambulatory Visit: Payer: Self-pay | Admitting: Internal Medicine

## 2021-10-19 ENCOUNTER — Ambulatory Visit (HOSPITAL_COMMUNITY)
Admission: RE | Admit: 2021-10-19 | Discharge: 2021-10-19 | Disposition: A | Discharge status: Home / Self Care | Payer: 59 | Source: Ambulatory Visit | Attending: Internal Medicine | Admitting: Internal Medicine

## 2021-10-19 DIAGNOSIS — R0609 Other forms of dyspnea: Secondary | ICD-10-CM | POA: Insufficient documentation

## 2021-10-19 LAB — PULMONARY FUNCTION TEST
DL/VA % pred: 82 %
DL/VA: 3.44 ml/min/mmHg/L
DLCO unc % pred: 54 %
DLCO unc: 15.34 ml/min/mmHg
FEF 25-75 Post: 3.14 L/sec
FEF 25-75 Pre: 3.35 L/sec
FEF2575-%Change-Post: -6 %
FEF2575-%Pred-Post: 104 %
FEF2575-%Pred-Pre: 110 %
FEV1-%Change-Post: 0 %
FEV1-%Pred-Post: 97 %
FEV1-%Pred-Pre: 97 %
FEV1-Post: 3.63 L
FEV1-Pre: 3.63 L
FEV1FVC-%Change-Post: 1 %
FEV1FVC-%Pred-Pre: 100 %
FEV6-%Change-Post: -1 %
FEV6-%Pred-Post: 99 %
FEV6-%Pred-Pre: 100 %
FEV6-Post: 4.65 L
FEV6-Pre: 4.72 L
FEV6FVC-%Change-Post: 0 %
FEV6FVC-%Pred-Post: 104 %
FEV6FVC-%Pred-Pre: 103 %
FVC-%Change-Post: -1 %
FVC-%Pred-Post: 95 %
FVC-%Pred-Pre: 97 %
FVC-Post: 4.67 L
FVC-Pre: 4.76 L
Post FEV1/FVC ratio: 78 %
Post FEV6/FVC ratio: 100 %
Pre FEV1/FVC ratio: 76 %
Pre FEV6/FVC Ratio: 99 %
RV % pred: 137 %
RV: 3.19 L
TLC % pred: 114 %
TLC: 8.22 L

## 2021-10-19 MED ORDER — ALBUTEROL SULFATE (2.5 MG/3ML) 0.083% IN NEBU
2.5000 mg | INHALATION_SOLUTION | Freq: Once | RESPIRATORY_TRACT | Status: AC
  Administered 2021-10-19: 2.5 mg via RESPIRATORY_TRACT

## 2021-10-26 NOTE — Progress Notes (Signed)
LMTCB

## 2021-10-29 ENCOUNTER — Encounter: Payer: Self-pay | Admitting: *Deleted

## 2021-10-29 NOTE — Progress Notes (Signed)
Called the pt and there was no answer- LMTCB and will mail letter per protocol.  

## 2022-03-18 ENCOUNTER — Other Ambulatory Visit: Payer: Self-pay | Admitting: Internal Medicine

## 2022-08-15 NOTE — Progress Notes (Unsigned)
Anthony [name], male    DOB: 12/02/1960     MRN: [id number]   Brief patient profile:  22 yowm  quit smoking 1984  referred to pulmonary clinic 03/11/2021 by Dr Huel Cote for copd eval.   Onset of wheezing 2018 p working [place] in [employer] - twice had CO issues hosp x 2  due to [employer]   History of Present Illness  03/11/2021  Pulmonary/ 1st office eval/Maylen Waltermire  Chief Complaint  Patient presents with   Consult    Patient is here to see if he has COPD, states he was told he does but was never given any recommendations. Patient has shortness of breath with exertion. States he uses his inhaler before going to bed and usually wakes up in the middle of the night to have to use it again. Patient's wife states that he snores a lot at night. Cough since August  Dyspnea:  progressive doe walking across parking lot x 2 months  Cough: dry hack came on same time  worse several hours p lie down / 4h after albuterol  Sleep: flat / 2 pillows - recliner 30 degrees  SABA use: every 4 hours  June 1st  2022 had covid p vax x 2  Episode of cp dx gerd 2005  Rec To get the most out of exercise, you need to be continuously aware that you are short of breath,   Make sure you check your oxygen saturations at highest level of activity to be sure your 02 sats are over 90% Pantoprazole (protonix) 40 mg   Take  30-60 min before first meal of the day and Pepcid (famotidine)  20 mg after supper until return to office - this is the best way to tell whether stomach acid is contributing to your problem.   If no improving after 2 weeks, call to schedule cpst  GERD diet reviewed, bed blocks rec  Only use your albuterol as a rescue medication  Ok to try albuterol 15 min before an activity (on alternating days)  that you know would usually make you short of breath  Work on inhaler technique:   Refer to sleep medicine next available    04/28/2021  f/u ov/[place] office/Eirene Rather re: doe  maint on symbicort  80 2bid and gerd rx   Chief Complaint  Patient presents with   Follow-up    Breathing has improved a little bit per patient   Dyspnea: walking to brother's house now s needing albuterol  Treadmill x 20 min at 3lpm almost level  Cough: better until caught cold a week prior to [place]: seeing sleep medicine Dec 7 SABA use: sev times a day but never before ex  02: none  Covid status: 2 vax / June 2022 omicrorn infected  Rec Ok to try albuterol 15 min before an activity (on alternating days)  that you know would usually make you short of breath and see if it makes any difference and if makes none then don't take albuterol after activity unless you can't catch your breath as this means it's the resting that helps, not the albuterol. Please schedule a follow up visit in 3 months  with PFTs    07/19/2021  f/u ov/[place] office/Chane Cowden re: doe maint on symb 58 2bid   Chief Complaint  Patient presents with   Follow-up    Breathing has continues to improve since last OV.   Dyspnea:  treadmill 3 -4 per week 20-30 min s 02  above 90% 2mh Cough: none  Sleeping: flat / sev pillows  SABA use: minimal usually p ex  02: none  Covid status: vax x 2/ June omicron 2022  Rec We will schedule a PFT next available  - don't use any inhalers prior  Only use your albuterol as a rescue medication  Ok to try albuterol 15 min before an activity (on alternating days)  that you know would usually make you short of breath           08/16/2022  Yearly f/u ov/[place] office/Ketina Mars re: doe  maint on nothing / just using tums, not protonix / pepcid  Chief Complaint  Patient presents with   Follow-up    Doing better since last ov    Dyspnea:  walking at work, up steps ok  Cough: none  Sleeping: lie flat/ one pillow under head  SABA use: 2-3 x in last year  02: none     No obvious day to day or daytime variability or assoc excess/ purulent sputum or mucus plugs or hemoptysis or cp or chest  tightness, subjective wheeze or overt sinus or hb symptoms.   Sleeping now  without nocturnal  or early am exacerbation  of respiratory  c/o's or need for noct saba. Also denies any obvious fluctuation of symptoms with weather or environmental changes or other aggravating or alleviating factors except as outlined above   No unusual exposure hx or h/o childhood pna/ asthma or knowledge of premature birth.  Current Allergies, Complete Past Medical History, Past Surgical History, Family History, and Social History [facility]  ROS  The following are not active complaints unless bolded Hoarseness, sore throat, dysphagia, dental problems, itching, sneezing,  nasal congestion or discharge of excess mucus or purulent secretions, ear ache,   fever, chills, sweats, unintended wt loss or wt gain, classically pleuritic or exertional cp,  orthopnea pnd or arm/hand swelling  or leg swelling, presyncope, palpitations, abdominal pain, anorexia, nausea, vomiting, diarrhea  or change in bowel habits or change in bladder habits, change in stools or change in urine, dysuria, hematuria,  rash, arthralgias, visual complaints, headache, numbness, weakness or ataxia or problems with walking or coordination,  change in mood or  memory.        Current Meds  Medication Sig   albuterol (VENTOLIN HFA) 108 (90 Base) MCG/ACT inhaler Inhale 2 puffs into the lungs every 4 (four) hours as needed for wheezing or shortness of breath.                    Objective:    wts   08/16/2022       237  07/19/2021       241   04/28/21 233 lb 0.6 oz (105.7 kg)  03/11/21 231 lb 6.4 oz (105 kg)  03/02/21 226 lb (102.5 kg)    Vital signs reviewed  08/16/2022  - Note at rest 02 sats  97% on RA   General appearance:    mildly obese amb wm nad   HEENT : Oropharynx  clear     Nasal turbinates nl    NECK :  without  apparent JVD/ palpable Nodes/TM    LUNGS: no acc muscle use,  Nl  contour chest which is clear to A and P bilaterally without cough on insp or exp maneuvers   CV:  RRR  no s3 or murmur or increase in P2, and no edema   ABD:  soft and nontender   MS:  Nl gait/ ext warm without deformities Or obvious joint restrictions  calf tenderness, cyanosis or clubbing    SKIN: warm and dry without lesions    NEURO:  alert, approp, nl sensorium with  no motor or cerebellar deficits apparent.          Assessment

## 2022-08-16 ENCOUNTER — Ambulatory Visit: Payer: 59 | Admitting: Internal Medicine

## 2022-08-16 ENCOUNTER — Encounter: Payer: Self-pay | Admitting: Internal Medicine

## 2022-08-16 VITALS — BP 132/74 | HR 74 | Ht 72.0 in | Wt 237.6 lb

## 2022-08-16 DIAGNOSIS — R0609 Other forms of dyspnea: Secondary | ICD-10-CM | POA: Diagnosis not present

## 2022-08-16 NOTE — Patient Instructions (Addendum)
In event of cough/ wheeze/ worse breathing > albuterol 2 puffs up to ever 4 hours as needed   Try prilosec otc '20mg'$   Take 30-60 min before first meal of the day and Pepcid ac (famotidine) 20 mg one @  bedtime until cough is completely gone for at least a week without the need for cough suppression  If still needing more than twice weekly albuterol after a few weeks above I need to see your back here.      If you are satisfied with your treatment plan,  let your doctor know and he/she can either refill your medications or you can return here when your prescription runs out.     If in any way you are not 100% satisfied,  please tell us.  If 100% better, tell your friends!  Pulmonary follow up is as needed

## 2022-08-16 NOTE — Assessment & Plan Note (Addendum)
Onset 2018 assoc with wheeze in pt with prior atypical cp in 2005 attributed to GERD  CTa 02/17/21  1. No CT evidence of pulmonary embolism. 2. Mild left upper lobe and bibasilar atelectasis. LHC  03/02/21 nl coronaries, nl LVEDP Echo 03/04/21  Nl  -  03/11/2021   Walked on RA x  3  lap(s) =  approx 750 @ mod to fast pace, stopped due to end of study, min sob  with lowest 02 sats 98%  - Allergy profile   03/12/21 >  Eos 1.1  /  IgE  249 > trial of symbicort 80 2bid rec 03/15/2021 >>> improved 04/28/2021  - 04/28/2021  After extensive coaching inhaler device,  effectiveness =    90%  - 04/28/2021   Walked on RA x  3  lap(s) =  approx 450 @ moderate pace, stopped due to end of study s sob  with lowest 02 sats 99 %  - PFT's  10/19/21  FEV1 3.63 (97 % ) ratio 0.76 with no improvement from saba p symbicort 80x 2  prior to study with DLCO  15.34 (54%) curve nl in effort indepedent portion  - 08/16/2022 off all gerd rx x tums, on rare saba s maint symbiocort > rec f/u prn   All goals of chronic asthma control met including optimal function and elimination of symptoms with minimal need for rescue therapy on no maint rx at all or any gerd rx .  Contingencies discussed in full including contacting this office immediately if not controlling the symptoms using the rule of two's.   see avs for instructions unique to this ov          Each maintenance medication was reviewed in detail including emphasizing most importantly the difference between maintenance and prns and under what circumstances the prns are to be triggered using an action plan format where appropriate.  Total time for H and P, chart review, counseling, reviewing hfa device(s) and generating customized AVS unique to this office visit / same day charting = 30 min final summary f/u ov.

## 2022-09-08 ENCOUNTER — Encounter: Payer: Self-pay | Admitting: Internal Medicine

## 2022-09-08 ENCOUNTER — Ambulatory Visit: Payer: 59 | Admitting: Internal Medicine

## 2022-09-08 VITALS — BP 127/76 | HR 68 | Ht 71.0 in | Wt 234.0 lb

## 2022-09-08 DIAGNOSIS — I2584 Coronary atherosclerosis due to calcified coronary lesion: Secondary | ICD-10-CM

## 2022-09-08 DIAGNOSIS — Z131 Encounter for screening for diabetes mellitus: Secondary | ICD-10-CM | POA: Diagnosis not present

## 2022-09-08 DIAGNOSIS — Z1159 Encounter for screening for other viral diseases: Secondary | ICD-10-CM | POA: Diagnosis not present

## 2022-09-08 DIAGNOSIS — G8929 Other chronic pain: Secondary | ICD-10-CM | POA: Insufficient documentation

## 2022-09-08 DIAGNOSIS — E669 Obesity, unspecified: Secondary | ICD-10-CM

## 2022-09-08 DIAGNOSIS — I2510 Atherosclerotic heart disease of native coronary artery without angina pectoris: Secondary | ICD-10-CM

## 2022-09-08 DIAGNOSIS — Z23 Encounter for immunization: Secondary | ICD-10-CM | POA: Insufficient documentation

## 2022-09-08 DIAGNOSIS — M545 Low back pain, unspecified: Secondary | ICD-10-CM

## 2022-09-08 DIAGNOSIS — Z0001 Encounter for general adult medical examination with abnormal findings: Secondary | ICD-10-CM | POA: Diagnosis not present

## 2022-09-08 DIAGNOSIS — R0609 Other forms of dyspnea: Secondary | ICD-10-CM

## 2022-09-08 DIAGNOSIS — Z114 Encounter for screening for human immunodeficiency virus [HIV]: Secondary | ICD-10-CM | POA: Diagnosis not present

## 2022-09-08 DIAGNOSIS — Z1329 Encounter for screening for other suspected endocrine disorder: Secondary | ICD-10-CM

## 2022-09-08 DIAGNOSIS — K439 Ventral hernia without obstruction or gangrene: Secondary | ICD-10-CM

## 2022-09-08 DIAGNOSIS — Z8719 Personal history of other diseases of the digestive system: Secondary | ICD-10-CM

## 2022-09-08 DIAGNOSIS — Z13220 Encounter for screening for lipoid disorders: Secondary | ICD-10-CM

## 2022-09-08 DIAGNOSIS — Z1321 Encounter for screening for nutritional disorder: Secondary | ICD-10-CM

## 2022-09-08 HISTORY — DX: Encounter for general adult medical examination with abnormal findings: Z00.01

## 2022-09-08 NOTE — Progress Notes (Signed)
New Patient Office Visit  Subjective    Patient ID: Anthony [name], male    DOB: March 29, 1961  Age: 62 y.o. MRN: [id number]  CC:  Chief Complaint  Patient presents with   Establish Care   HPI Anthony [name] presents to establish care.  He is a 62 year old male with a past medical history significant for GERD, prediabetes, dyspnea on exertion, chronic back pain, obesity, and coronary artery calcifications.  He has most recently been followed by Dr. Huel Cote with [facility].  Anthony [name] reports feeling fairly well today.  His acute concern is that he believes he has an abdominal hernia.  He is otherwise asymptomatic.  Anthony [name] currently works at [employer] in [place].  He endorses former tobacco use, quitting in 1984, as well as occasional alcohol consumption.  He denies illicit drug use.  His family medical history is significant for CAD and aggressive abdominal cancer in his sister, COPD, diabetes, and schizophrenia.  Acute concerns, chronic medical conditions, and outstanding preventative care items discussed today are individually addressed A/P below.  Outpatient Encounter Medications as of 09/08/2022  Medication Sig   albuterol (VENTOLIN HFA) 108 (90 Base) MCG/ACT inhaler Inhale 2 puffs into the lungs every 4 (four) hours as needed for wheezing or shortness of breath.   No facility-administered encounter medications on file as of 09/08/2022.    Past Medical History:  Diagnosis Date   Asthma 2020   COPD   COPD (chronic obstructive pulmonary disease)    Encounter for general adult medical examination with abnormal findings 09/08/2022    Past Surgical History:  Procedure Laterality Date   RIGHT/LEFT HEART CATH AND CORONARY ANGIOGRAPHY N/A 03/02/2021   Procedure: RIGHT/LEFT HEART CATH AND CORONARY ANGIOGRAPHY;  Surgeon: Belva Crome, MD;  Location: [facility];  Service: Cardiovascular;  Laterality: N/A;   SPINE SURGERY  2017 or 2018   lower back    Family History  Problem  Relation Age of Onset   Asthma Mother    Cancer Mother    Heart disease Father    Cancer Sister    Cancer Brother    Heart disease Brother     Social History   Socioeconomic History   Marital status: Married    Spouse name: Not on file   Number of children: Not on file   Years of education: Not on file   Highest education level: Not on file  Occupational History   Not on file  Tobacco Use   Smoking status: Former    Packs/day: 0.25    Years: 1.00    Additional pack years: 0.00    Total pack years: 0.25    Types: Cigarettes    Quit date: 78    Years since quitting: 40.2   Smokeless tobacco: Never  Vaping Use   Vaping Use: Never used  Substance and Sexual Activity   Alcohol use: Not Currently    Alcohol/week: 2.0 standard drinks of alcohol    Types: 2 Cans of beer per week   Drug use: Never   Sexual activity: Yes    Comment: married  Other Topics Concern   Not on file  Social History Narrative   Not on file   Social Determinants of Health   Financial Resource Strain: Not on file  Food Insecurity: Not on file  Transportation Needs: Not on file  Physical Activity: Not on file  Stress: Not on file  Social Connections: Not on file  Intimate Partner Violence: Not on  file   Review of Systems  Constitutional:  Negative for chills and fever.  HENT:  Negative for sore throat.   Respiratory:  Negative for cough and shortness of breath.   Cardiovascular:  Negative for chest pain, palpitations and leg swelling.  Gastrointestinal:  Negative for abdominal pain, blood in stool, constipation, diarrhea, nausea and vomiting.  Genitourinary:  Negative for dysuria and hematuria.  Musculoskeletal:  Negative for myalgias.  Skin:  Negative for itching and rash.  Neurological:  Negative for dizziness and headaches.  Psychiatric/Behavioral:  Negative for depression and suicidal ideas.    Objective    BP 127/76   Pulse 68   Ht 5\' 11"  (1.803 m)   Wt 234 lb (106.1 kg)    SpO2 97%   BMI 32.64 kg/m   Physical Exam Vitals reviewed.  Constitutional:      General: He is not in acute distress.    Appearance: Normal appearance. He is obese. He is not ill-appearing.  HENT:     Head: Normocephalic and atraumatic.     Right Ear: External ear normal.     Left Ear: External ear normal.     Nose: Nose normal. No congestion or rhinorrhea.     Mouth/Throat:     Mouth: Mucous membranes are moist.     Pharynx: Oropharynx is clear.  Eyes:     General: No scleral icterus.    Extraocular Movements: Extraocular movements intact.     Conjunctiva/sclera: Conjunctivae normal.     Pupils: Pupils are equal, round, and reactive to light.  Cardiovascular:     Rate and Rhythm: Normal rate and regular rhythm.     Pulses: Normal pulses.     Heart sounds: Normal heart sounds. No murmur heard. Pulmonary:     Effort: Pulmonary effort is normal.     Breath sounds: Normal breath sounds. No wheezing, rhonchi or rales.  Abdominal:     General: Abdomen is flat. Bowel sounds are normal. There is no distension.     Palpations: Abdomen is soft.     Tenderness: There is no abdominal tenderness.     Hernia: A hernia (reducible ventral hernia) is present.  Musculoskeletal:        General: No swelling or deformity. Normal range of motion.     Cervical back: Normal range of motion.  Skin:    General: Skin is warm and dry.     Capillary Refill: Capillary refill takes less than 2 seconds.  Neurological:     General: No focal deficit present.     Mental Status: He is alert and oriented to person, place, and time.     Motor: No weakness.  Psychiatric:        Mood and Affect: Mood normal.        Behavior: Behavior normal.        Thought Content: Thought content normal.    Assessment & Plan:   Problem List Items Addressed This Visit       Coronary artery calcification    Noted on prior imaging from September 2020.  Subsequently underwent LHC that showed normal coronaries without  significant arteriosclerosis.  Denies any recent chest pain.  Not currently followed by cardiology. -No medication changes today      DOE (dyspnea on exertion)    Followed by pulmonology (Dr. Melvyn Novas).  Recently seen for follow-up.  PFTs not consistent with COPD.  He is currently prescribed an albuterol inhaler for as needed use but states that he seldom  needs to use it.  Cardiopulmonary workup has largely been unremarkable.  Normal pulmonary exam today.  Asymptomatic currently. -No medication changes today.  Continue as needed use of albuterol      History of gastroesophageal reflux (GERD)    Previously prescribed Protonix and Pepcid.  Asymptomatic currently.  He is not taking any antacid medications regularly.  Knows to avoid triggers. -No medication changes today      Chronic low back pain    He endorses chronic lumbar back pain today.  Previously has undergone surgery on his lower back in 2017.  Currently taking Advil as needed for pain relief, which he seldom needs. -No medication changes today      Obesity (BMI 30.0-34.9)    BMI 32.6.  He is currently taking a weight loss supplement (Puravive).  He is motivated to continue lifestyle modifications aimed at losing weight. -No medication changes today.  Baseline labs been ordered.      Encounter for general adult medical examination with abnormal findings - Primary    Presenting today to establish care.  Previous records and labs been reviewed. -Baseline labs ordered today, including one-time HIV/HCV screenings -Tdap vaccine administered today -We will tentatively plan for follow-up in 6 months      Need for Tdap vaccination    Tdap vaccine administered today      Ventral hernia    Reducible ventral hernia noted on exam today.  This is not causing him any discomfort.  He is not interested in a referral to general surgery at this time.      Return in about 6 months (around 03/10/2023).   Johnette Abraham, MD

## 2022-09-08 NOTE — Assessment & Plan Note (Signed)
Previously prescribed Protonix and Pepcid.  Asymptomatic currently.  He is not taking any antacid medications regularly.  Knows to avoid triggers. -No medication changes today

## 2022-09-08 NOTE — Assessment & Plan Note (Signed)
Reducible ventral hernia noted on exam today.  This is not causing him any discomfort.  He is not interested in a referral to general surgery at this time.

## 2022-09-08 NOTE — Assessment & Plan Note (Signed)
BMI 32.6.  He is currently taking a weight loss supplement (Puravive).  He is motivated to continue lifestyle modifications aimed at losing weight. -No medication changes today.  Baseline labs been ordered.

## 2022-09-08 NOTE — Assessment & Plan Note (Signed)
He endorses chronic lumbar back pain today.  Previously has undergone surgery on his lower back in 2017.  Currently taking Advil as needed for pain relief, which he seldom needs. -No medication changes today

## 2022-09-08 NOTE — Assessment & Plan Note (Signed)
Presenting today to establish care.  Previous records and labs been reviewed. -Baseline labs ordered today, including one-time HIV/HCV screenings -Tdap vaccine administered today -We will tentatively plan for follow-up in 6 months

## 2022-09-08 NOTE — Patient Instructions (Addendum)
It was a pleasure to see you today.  Thank you for giving [place] the opportunity to be involved in your care.  Below is a brief recap of your visit and next steps.  We will plan to see you again in 6 months.  Summary You have established care today. We will check basic labs You will receive your tetanus vaccine today Plan for follow up in 6 months

## 2022-09-08 NOTE — Assessment & Plan Note (Signed)
Noted on prior imaging from September 2020.  Subsequently underwent LHC that showed normal coronaries without significant arteriosclerosis.  Denies any recent chest pain.  Not currently followed by cardiology. -No medication changes today

## 2022-09-08 NOTE — Assessment & Plan Note (Signed)
-   Tdap vaccine administered today.

## 2022-09-08 NOTE — Assessment & Plan Note (Signed)
Followed by pulmonology (Dr. Melvyn Novas).  Recently seen for follow-up.  PFTs not consistent with COPD.  He is currently prescribed an albuterol inhaler for as needed use but states that he seldom needs to use it.  Cardiopulmonary workup has largely been unremarkable.  Normal pulmonary exam today.  Asymptomatic currently. -No medication changes today.  Continue as needed use of albuterol

## 2022-09-10 LAB — TSH+FREE T4
Free T4: 0.97 ng/dL (ref 0.82–1.77)
TSH: 3.14 u[IU]/mL (ref 0.450–4.500)

## 2022-09-10 LAB — CBC WITH DIFFERENTIAL/PLATELET
Basophils Absolute: 0.1 10*3/uL (ref 0.0–0.2)
Basos: 1 %
EOS (ABSOLUTE): 0.3 10*3/uL (ref 0.0–0.4)
Eos: 7 %
Hematocrit: 46.3 % (ref 37.5–51.0)
Hemoglobin: 15.3 g/dL (ref 13.0–17.7)
Immature Grans (Abs): 0 10*3/uL (ref 0.0–0.1)
Immature Granulocytes: 0 %
Lymphocytes Absolute: 1.7 10*3/uL (ref 0.7–3.1)
Lymphs: 35 %
MCH: 31.1 pg (ref 26.6–33.0)
MCHC: 33 g/dL (ref 31.5–35.7)
MCV: 94 fL (ref 79–97)
Monocytes Absolute: 0.5 10*3/uL (ref 0.1–0.9)
Monocytes: 11 %
Neutrophils Absolute: 2.2 10*3/uL (ref 1.4–7.0)
Neutrophils: 46 %
Platelets: 159 10*3/uL (ref 150–450)
RBC: 4.92 x10E6/uL (ref 4.14–5.80)
RDW: 12.4 % (ref 11.6–15.4)
WBC: 4.9 10*3/uL (ref 3.4–10.8)

## 2022-09-10 LAB — LIPID PANEL
Chol/HDL Ratio: 3.5 ratio (ref 0.0–5.0)
Cholesterol, Total: 186 mg/dL (ref 100–199)
HDL: 53 mg/dL (ref >39)
LDL Chol Calc (NIH): 121 mg/dL — ABNORMAL HIGH (ref 0–99)
Triglycerides: 65 mg/dL (ref 0–149)
VLDL Cholesterol Cal: 12 mg/dL (ref 5–40)

## 2022-09-10 LAB — CMP14+EGFR
ALT: 24 IU/L (ref 0–44)
AST: 22 IU/L (ref 0–40)
Albumin/Globulin Ratio: 2.5 — ABNORMAL HIGH (ref 1.2–2.2)
Albumin: 4.2 g/dL (ref 3.9–4.9)
Alkaline Phosphatase: 94 IU/L (ref 44–121)
BUN/Creatinine Ratio: 19 (ref 10–24)
BUN: 18 mg/dL (ref 8–27)
Bilirubin Total: 0.5 mg/dL (ref 0.0–1.2)
CO2: 23 mmol/L (ref 20–29)
Calcium: 8.8 mg/dL (ref 8.6–10.2)
Chloride: 105 mmol/L (ref 96–106)
Creatinine, Ser: 0.95 mg/dL (ref 0.76–1.27)
Globulin, Total: 1.7 g/dL (ref 1.5–4.5)
Glucose: 119 mg/dL — ABNORMAL HIGH (ref 70–99)
Potassium: 4.2 mmol/L (ref 3.5–5.2)
Sodium: 140 mmol/L (ref 134–144)
Total Protein: 5.9 g/dL — ABNORMAL LOW (ref 6.0–8.5)
eGFR: 90 mL/min/{1.73_m2} (ref >59)

## 2022-09-10 LAB — VITAMIN D 25 HYDROXY (VIT D DEFICIENCY, FRACTURES): Vit D, 25-Hydroxy: 23.8 ng/mL — ABNORMAL LOW (ref 30.0–100.0)

## 2022-09-10 LAB — B12 AND FOLATE PANEL
Folate: 5.5 ng/mL (ref >3.0)
Vitamin B-12: 327 pg/mL (ref 232–1245)

## 2022-09-10 LAB — HEMOGLOBIN A1C
Est. average glucose Bld gHb Est-mCnc: 134 mg/dL
Hgb A1c MFr Bld: 6.3 % — ABNORMAL HIGH (ref 4.8–5.6)

## 2022-09-10 LAB — HCV INTERPRETATION

## 2022-09-10 LAB — HCV AB W REFLEX TO QUANT PCR: HCV Ab: NONREACTIVE

## 2022-09-10 LAB — HIV ANTIBODY (ROUTINE TESTING W REFLEX): HIV Screen 4th Generation wRfx: NONREACTIVE

## 2022-09-13 ENCOUNTER — Other Ambulatory Visit: Payer: Self-pay | Admitting: Internal Medicine

## 2022-09-13 DIAGNOSIS — E785 Hyperlipidemia, unspecified: Secondary | ICD-10-CM

## 2022-09-13 DIAGNOSIS — R7303 Prediabetes: Secondary | ICD-10-CM

## 2022-09-13 MED ORDER — METFORMIN HCL ER 500 MG PO TB24: 500.0000 mg | ORAL_TABLET | Freq: Two times a day (BID) | ORAL | 2 refills | Status: DC

## 2022-10-15 ENCOUNTER — Other Ambulatory Visit: Payer: Self-pay | Admitting: Internal Medicine

## 2022-10-15 DIAGNOSIS — R7303 Prediabetes: Secondary | ICD-10-CM

## 2022-10-15 DIAGNOSIS — E785 Hyperlipidemia, unspecified: Secondary | ICD-10-CM

## 2023-01-27 LAB — HM DIABETES EYE EXAM

## 2023-03-15 ENCOUNTER — Encounter: Payer: Self-pay | Admitting: Internal Medicine

## 2023-03-15 ENCOUNTER — Ambulatory Visit: Payer: 59 | Admitting: Internal Medicine

## 2023-03-15 VITALS — BP 133/78 | HR 60 | Ht 71.0 in | Wt 232.4 lb

## 2023-03-15 DIAGNOSIS — E119 Type 2 diabetes mellitus without complications: Secondary | ICD-10-CM | POA: Insufficient documentation

## 2023-03-15 DIAGNOSIS — E785 Hyperlipidemia, unspecified: Secondary | ICD-10-CM | POA: Insufficient documentation

## 2023-03-15 DIAGNOSIS — E7841 Elevated Lipoprotein(a): Secondary | ICD-10-CM | POA: Diagnosis not present

## 2023-03-15 DIAGNOSIS — Z2821 Immunization not carried out because of patient refusal: Secondary | ICD-10-CM

## 2023-03-15 DIAGNOSIS — E559 Vitamin D deficiency, unspecified: Secondary | ICD-10-CM | POA: Diagnosis not present

## 2023-03-15 DIAGNOSIS — R7303 Prediabetes: Secondary | ICD-10-CM | POA: Insufficient documentation

## 2023-03-15 DIAGNOSIS — E1169 Type 2 diabetes mellitus with other specified complication: Secondary | ICD-10-CM | POA: Insufficient documentation

## 2023-03-15 HISTORY — DX: Type 2 diabetes mellitus without complications: E11.9

## 2023-03-15 HISTORY — DX: Hyperlipidemia, unspecified: E78.5

## 2023-03-15 NOTE — Assessment & Plan Note (Signed)
Noted on labs from April.  Daily vitamin D supplementation recommended.

## 2023-03-15 NOTE — Progress Notes (Signed)
Established Patient Office Visit  Subjective   Patient ID: Anthony [name], male    DOB: 07/12/1960  Age: 62 y.o. MRN: [id number]  Chief Complaint  Patient presents with   Follow-up    6 mo  chronic   Mr. Anthony [name] returns to care today for routine follow-up.  He was last evaluated by me on 4/4 as a new patient presenting to establish care.  No medication changes were made and 25-month follow-up was arranged.  There have been no acute interval events. Mr. Anthony [name] reports feeling fairly well today.  He endorses increased fatigue since starting metformin.  He does not have any additional concerns to discuss today.  Past Medical History:  Diagnosis Date   Asthma 2020   COPD   COPD (chronic obstructive pulmonary disease) (HCC)    Encounter for general adult medical examination with abnormal findings 09/08/2022   Hyperlipidemia 03/15/2023   Past Surgical History:  Procedure Laterality Date   RIGHT/LEFT HEART CATH AND CORONARY ANGIOGRAPHY N/A 03/02/2021   Procedure: RIGHT/LEFT HEART CATH AND CORONARY ANGIOGRAPHY;  Surgeon: Lyn Records, MD;  Location: [facility];  Service: Cardiovascular;  Laterality: N/A;   SPINE SURGERY  2017 or 2018   lower back   Social History   Tobacco Use   Smoking status: Former    Current packs/day: 0.00    Average packs/day: 0.3 packs/day for 1 year (0.3 ttl pk-yrs)    Types: Cigarettes    Start date: 40    Quit date: 1984    Years since quitting: 40.8   Smokeless tobacco: Never  Vaping Use   Vaping status: Never Used  Substance Use Topics   Alcohol use: Not Currently    Alcohol/week: 2.0 standard drinks of alcohol    Types: 2 Cans of beer per week   Drug use: Never   Family History  Problem Relation Age of Onset   Asthma Mother    Cancer Mother    Heart disease Father    Cancer Sister    Cancer Brother    Heart disease Brother    Allergies  Allergen Reactions   Morphine Other (See Comments)    Lowers blood pressure   Pentazocine      Drops blood pressure   Review of Systems  Constitutional:  Positive for malaise/fatigue (w/ metformin). Negative for chills and fever.  HENT:  Negative for sore throat.   Respiratory:  Negative for cough and shortness of breath.   Cardiovascular:  Negative for chest pain, palpitations and leg swelling.  Gastrointestinal:  Negative for abdominal pain, blood in stool, constipation, diarrhea, nausea and vomiting.  Genitourinary:  Negative for dysuria and hematuria.  Musculoskeletal:  Negative for myalgias.  Skin:  Negative for itching and rash.  Neurological:  Negative for dizziness and headaches.  Psychiatric/Behavioral:  Negative for depression and suicidal ideas.      Objective:     BP 133/78 (BP Location: Right Arm, Patient Position: Sitting, Cuff Size: Normal)   Pulse 60   Ht 5\' 11"  (1.803 m)   Wt 232 lb 6.4 oz (105.4 kg)   SpO2 97%   BMI 32.41 kg/m  BP Readings from Last 3 Encounters:  03/15/23 133/78  09/08/22 127/76  08/16/22 132/74   Physical Exam Vitals reviewed.  Constitutional:      General: He is not in acute distress.    Appearance: Normal appearance. He is obese. He is not ill-appearing.  HENT:     Head: Normocephalic and atraumatic.  Right Ear: External ear normal.     Left Ear: External ear normal.     Nose: Nose normal. No congestion or rhinorrhea.     Mouth/Throat:     Mouth: Mucous membranes are moist.     Pharynx: Oropharynx is clear.  Eyes:     General: No scleral icterus.    Extraocular Movements: Extraocular movements intact.     Conjunctiva/sclera: Conjunctivae normal.     Pupils: Pupils are equal, round, and reactive to light.  Cardiovascular:     Rate and Rhythm: Normal rate and regular rhythm.     Pulses: Normal pulses.     Heart sounds: Normal heart sounds. No murmur heard. Pulmonary:     Effort: Pulmonary effort is normal.     Breath sounds: Normal breath sounds. No wheezing, rhonchi or rales.  Abdominal:     General: Abdomen  is flat. Bowel sounds are normal. There is no distension.     Palpations: Abdomen is soft.     Tenderness: There is no abdominal tenderness.     Hernia: A hernia (reducible ventral hernia) is present.  Musculoskeletal:        General: No swelling or deformity. Normal range of motion.     Cervical back: Normal range of motion.  Skin:    General: Skin is warm and dry.     Capillary Refill: Capillary refill takes less than 2 seconds.  Neurological:     General: No focal deficit present.     Mental Status: He is alert and oriented to person, place, and time.     Motor: No weakness.  Psychiatric:        Mood and Affect: Mood normal.        Behavior: Behavior normal.        Thought Content: Thought content normal.   Last CBC Lab Results  Component Value Date   WBC 4.9 09/09/2022   HGB 15.3 09/09/2022   HCT 46.3 09/09/2022   MCV 94 09/09/2022   MCH 31.1 09/09/2022   RDW 12.4 09/09/2022   PLT 159 09/09/2022   Last metabolic panel Lab Results  Component Value Date   GLUCOSE 119 (H) 09/09/2022   NA 140 09/09/2022   K 4.2 09/09/2022   CL 105 09/09/2022   CO2 23 09/09/2022   BUN 18 09/09/2022   CREATININE 0.95 09/09/2022   EGFR 90 09/09/2022   CALCIUM 8.8 09/09/2022   PROT 5.9 (L) 09/09/2022   ALBUMIN 4.2 09/09/2022   LABGLOB 1.7 09/09/2022   AGRATIO 2.5 (H) 09/09/2022   BILITOT 0.5 09/09/2022   ALKPHOS 94 09/09/2022   AST 22 09/09/2022   ALT 24 09/09/2022   ANIONGAP 8 02/17/2021   Last lipids Lab Results  Component Value Date   CHOL 186 09/09/2022   HDL 53 09/09/2022   LDLCALC 121 (H) 09/09/2022   TRIG 65 09/09/2022   CHOLHDL 3.5 09/09/2022   Last hemoglobin A1c Lab Results  Component Value Date   HGBA1C 6.3 (H) 09/09/2022   Last thyroid functions Lab Results  Component Value Date   TSH 3.140 09/09/2022   Last vitamin D Lab Results  Component Value Date   VD25OH 23.8 (L) 09/09/2022   Last vitamin B12 and Folate Lab Results  Component Value Date    VITAMINB12 327 09/09/2022   FOLATE 5.5 09/09/2022   The 10-year ASCVD risk score (Arnett DK, et al., 2019) is: 9.8%    Assessment & Plan:   Problem List Items Addressed This Visit  Prediabetes - Primary    A1c 6.3 on labs from April.  Metformin XR 500 mg twice daily was started as a result.  Today he endorses increasing fatigue since starting metformin. -Discontinue metformin -Repeat BMP ordered today -States that he recently completed biometric testing, including A1c, for his insurance company.  He will follow-up on these results and forward them to our office.      Relevant Orders   Basic Metabolic Panel (BMET)   Hyperlipidemia    Lipid panel updated in April.  Total cholesterol 186 and LDL 121.  His 10-year ASCVD risk is 9.8% today. -No medication changes today.  He will continue to focus on dietary changes in an effort to lower his LDL.      Vitamin D insufficiency    Noted on labs from April.  Daily vitamin D supplementation recommended.      Return in about 2 months (around 05/15/2023) for CPE.   Billie Lade, MD

## 2023-03-15 NOTE — Assessment & Plan Note (Signed)
Lipid panel updated in April.  Total cholesterol 186 and LDL 121.  His 10-year ASCVD risk is 9.8% today. -No medication changes today.  He will continue to focus on dietary changes in an effort to lower his LDL.

## 2023-03-15 NOTE — Assessment & Plan Note (Signed)
A1c 6.3 on labs from April.  Metformin XR 500 mg twice daily was started as a result.  Today he endorses increasing fatigue since starting metformin. -Discontinue metformin -Repeat BMP ordered today -States that he recently completed biometric testing, including A1c, for his insurance company.  He will follow-up on these results and forward them to our office.

## 2023-03-15 NOTE — Patient Instructions (Signed)
It was a pleasure to see you today.  Thank you for giving [place] the opportunity to be involved in your care.  Below is a brief recap of your visit and next steps.  We will plan to see you again in 2 months.   Summary Discontinue metformin Repeat chemistry panel today Follow up before end of year for physical

## 2023-03-16 LAB — BASIC METABOLIC PANEL
BUN/Creatinine Ratio: 20 (ref 10–24)
BUN: 18 mg/dL (ref 8–27)
CO2: 20 mmol/L (ref 20–29)
Calcium: 8.9 mg/dL (ref 8.6–10.2)
Chloride: 104 mmol/L (ref 96–106)
Creatinine, Ser: 0.92 mg/dL (ref 0.76–1.27)
Glucose: 89 mg/dL (ref 70–99)
Potassium: 4.1 mmol/L (ref 3.5–5.2)
Sodium: 141 mmol/L (ref 134–144)
eGFR: 94 mL/min/{1.73_m2} (ref >59)

## 2023-05-03 ENCOUNTER — Other Ambulatory Visit: Payer: Self-pay | Admitting: Internal Medicine

## 2023-05-03 DIAGNOSIS — E785 Hyperlipidemia, unspecified: Secondary | ICD-10-CM

## 2023-05-03 DIAGNOSIS — R7303 Prediabetes: Secondary | ICD-10-CM

## 2023-05-16 IMAGING — CR DG CHEST 2V
2 series · 2 of 2 positions shown · non-contrast
Comparison: None.

CLINICAL DATA: Worsening shortness of breath, fatigue, night
sweats, and loss of taste.

EXAM:
CHEST - 2 VIEW

[chest pa]
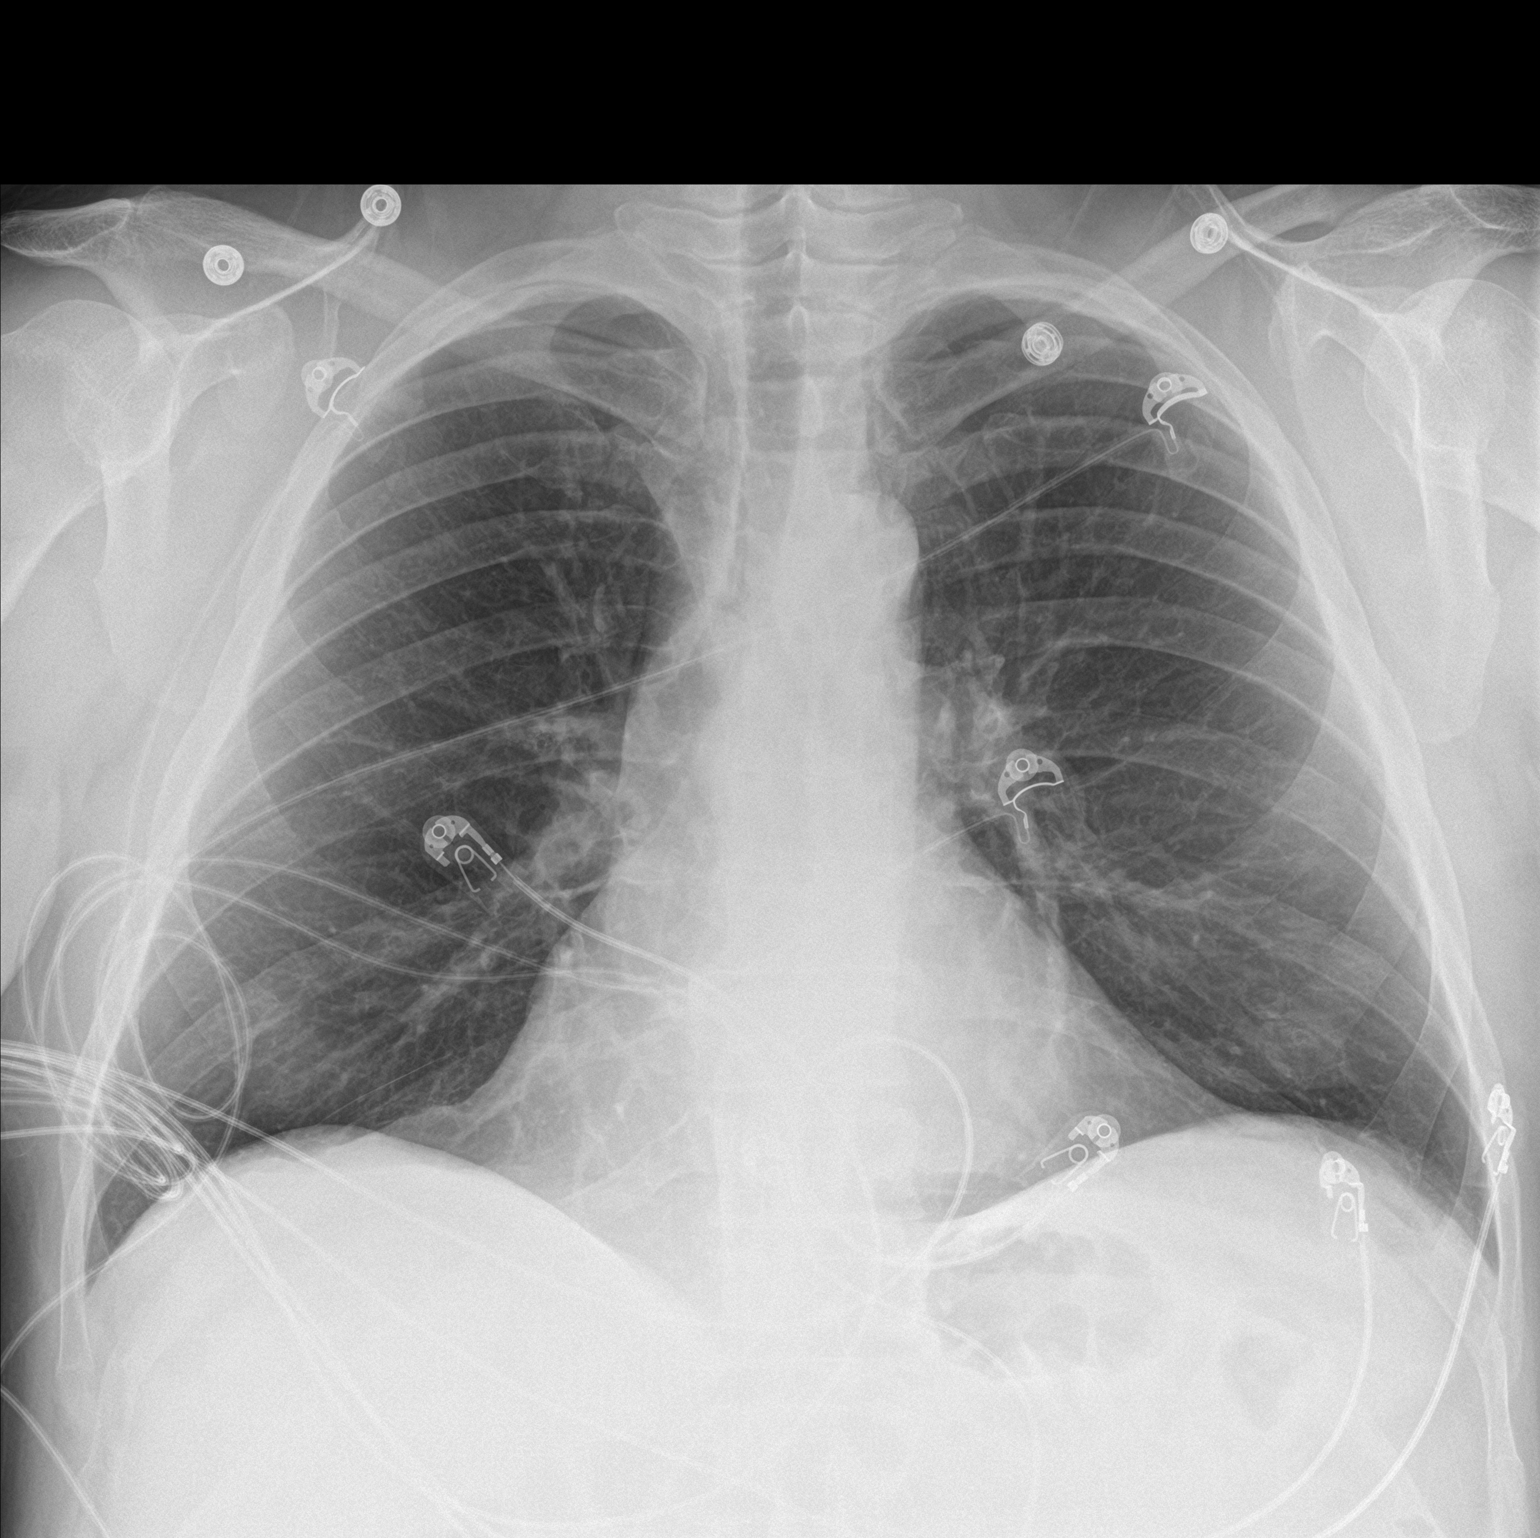

[chest lat]
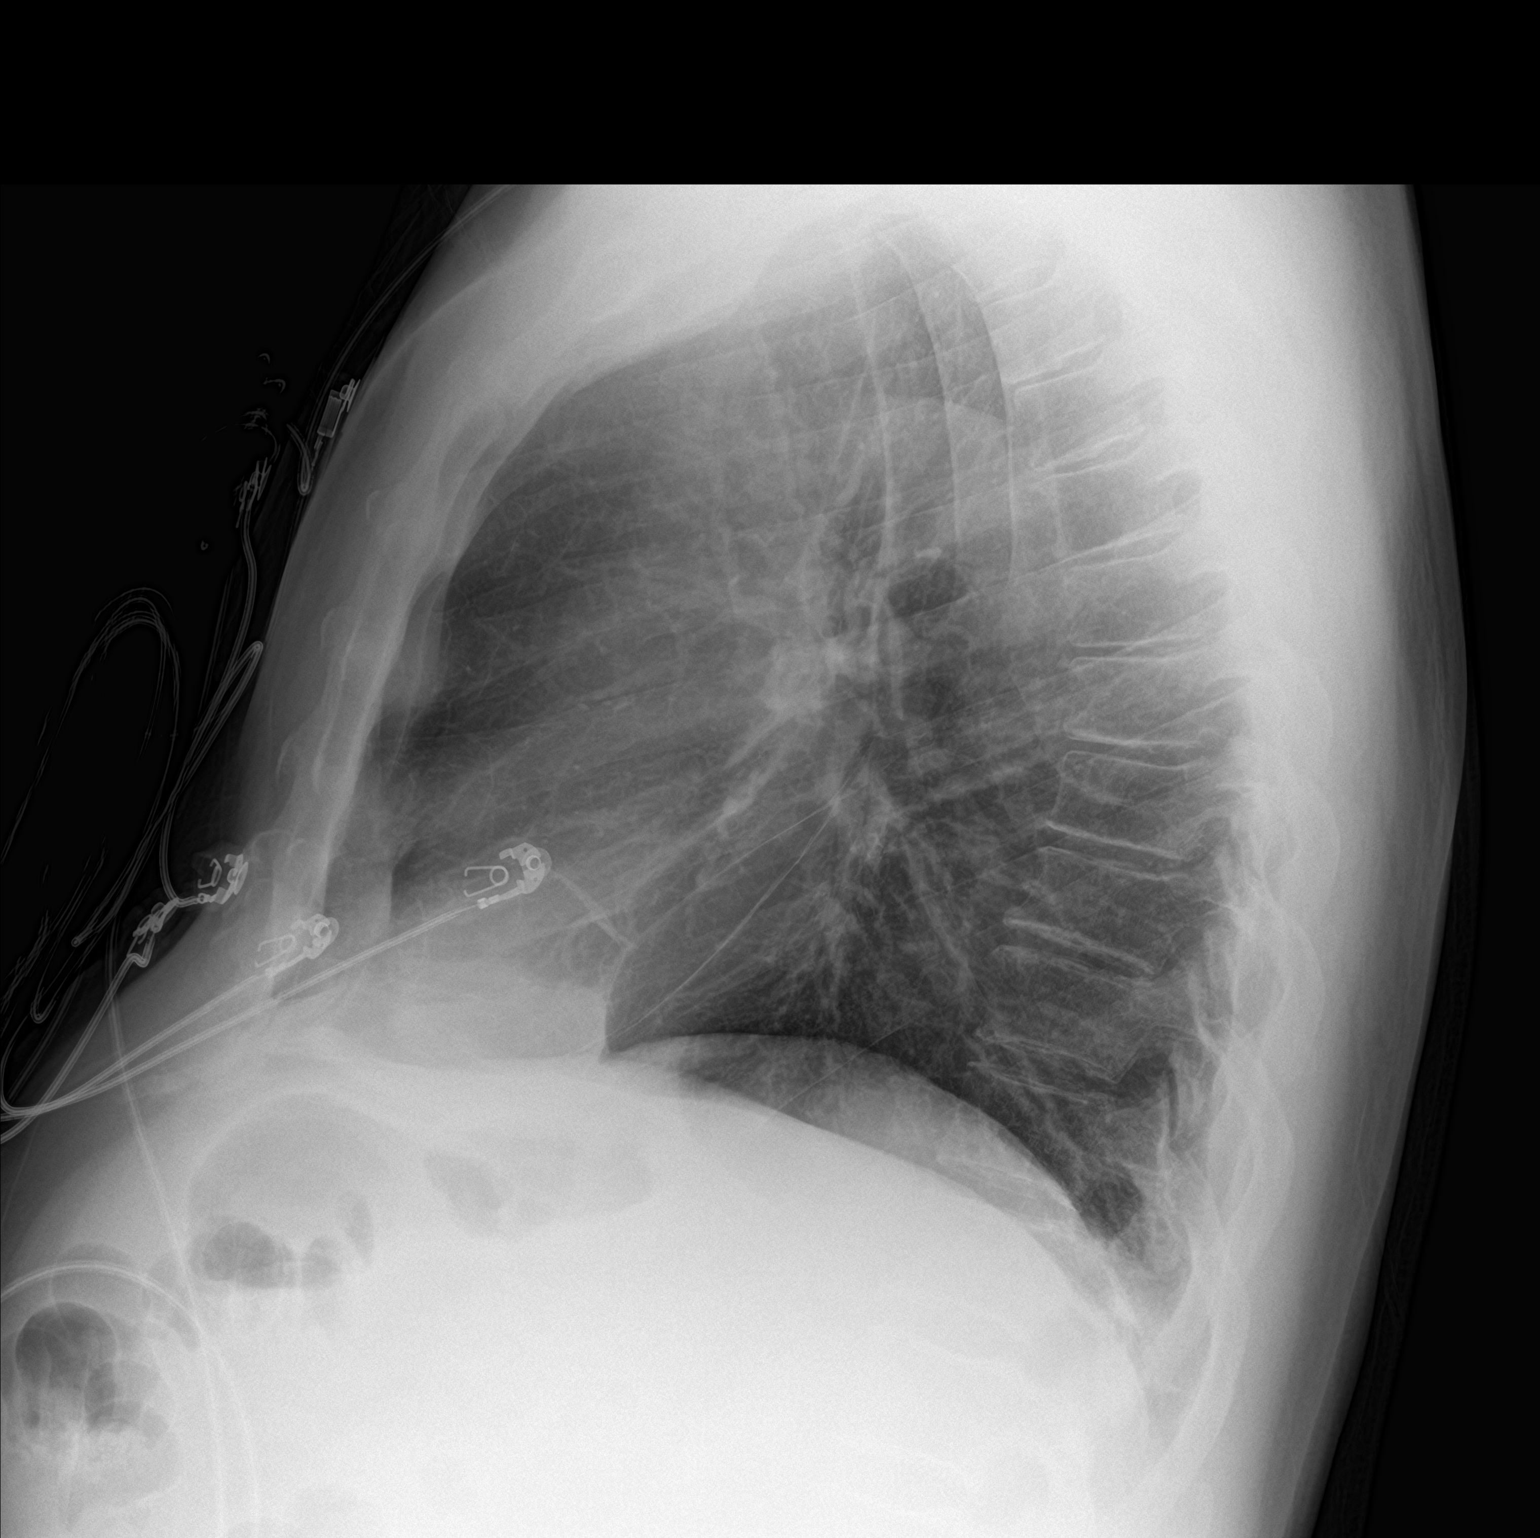

[2 of 2 positions shown; findings below may reference images not displayed]

FINDINGS: Heart size and pulmonary vascularity are normal. Lungs are clear. No
pleural effusions. No pneumothorax. Mediastinal contours appear
intact. Degenerative changes in the spine.
IMPRESSION: No active cardiopulmonary disease.

## 2023-05-19 ENCOUNTER — Ambulatory Visit (INDEPENDENT_AMBULATORY_CARE_PROVIDER_SITE_OTHER): Payer: 59 | Admitting: Internal Medicine

## 2023-05-19 ENCOUNTER — Encounter: Payer: Self-pay | Admitting: Internal Medicine

## 2023-05-19 VITALS — BP 142/77 | HR 67 | Ht 71.0 in | Wt 228.6 lb

## 2023-05-19 DIAGNOSIS — E119 Type 2 diabetes mellitus without complications: Secondary | ICD-10-CM

## 2023-05-19 DIAGNOSIS — E785 Hyperlipidemia, unspecified: Secondary | ICD-10-CM | POA: Diagnosis not present

## 2023-05-19 DIAGNOSIS — Z8719 Personal history of other diseases of the digestive system: Secondary | ICD-10-CM

## 2023-05-19 DIAGNOSIS — E559 Vitamin D deficiency, unspecified: Secondary | ICD-10-CM

## 2023-05-19 DIAGNOSIS — E1169 Type 2 diabetes mellitus with other specified complication: Secondary | ICD-10-CM

## 2023-05-19 DIAGNOSIS — Z0001 Encounter for general adult medical examination with abnormal findings: Secondary | ICD-10-CM | POA: Diagnosis not present

## 2023-05-19 DIAGNOSIS — E66811 Obesity, class 1: Secondary | ICD-10-CM

## 2023-05-19 DIAGNOSIS — Z7984 Long term (current) use of oral hypoglycemic drugs: Secondary | ICD-10-CM | POA: Diagnosis not present

## 2023-05-19 MED ORDER — LISINOPRIL 2.5 MG PO TABS: 2.5000 mg | ORAL_TABLET | Freq: Every day | ORAL | 3 refills | Status: DC

## 2023-05-19 MED ORDER — ATORVASTATIN CALCIUM 20 MG PO TABS: 20.0000 mg | ORAL_TABLET | Freq: Every day | ORAL | 3 refills | Status: DC

## 2023-05-19 NOTE — Progress Notes (Signed)
Complete physical exam  Patient: Anthony [name]   DOB: 1960-11-10   62 y.o. Male  MRN: [id number]  Subjective:    Chief Complaint  Patient presents with   Annual Exam    Anthony [name] is a 62 y.o. male who presents today for a complete physical exam. He reports consuming a general diet. Home exercise routine includes cycling regularly. He generally feels well. He reports sleeping fairly well. He does have additional problems to discuss today. He would like to review recent labs results from biometric testing.   Most recent fall risk assessment:    05/19/2023    1:01 PM  Fall Risk   Falls in the past year? 0  Number falls in past yr: 0  Injury with Fall? 0  Risk for fall due to : No Fall Risks  Follow up Falls evaluation completed     Most recent depression screenings:    05/19/2023    1:01 PM 03/15/2023    1:06 PM  PHQ 2/9 Scores  PHQ - 2 Score 0 0  PHQ- 9 Score 1 1   Vision:Within last year and Dental: No current dental problems and Receives regular dental care  Past Medical History:  Diagnosis Date   Asthma 2020   COPD   COPD (chronic obstructive pulmonary disease) (HCC)    Encounter for general adult medical examination with abnormal findings 09/08/2022   Hyperlipidemia 03/15/2023   Type 2 diabetes mellitus without complications (HCC) 03/15/2023   Past Surgical History:  Procedure Laterality Date   RIGHT/LEFT HEART CATH AND CORONARY ANGIOGRAPHY N/A 03/02/2021   Procedure: RIGHT/LEFT HEART CATH AND CORONARY ANGIOGRAPHY;  Surgeon: Lyn Records, MD;  Location: [facility];  Service: Cardiovascular;  Laterality: N/A;   SPINE SURGERY  2017 or 2018   lower back   Social History   Tobacco Use   Smoking status: Former    Current packs/day: 0.00    Average packs/day: 0.3 packs/day for 1 year (0.3 ttl pk-yrs)    Types: Cigarettes    Start date: 43    Quit date: 1984    Years since quitting: 40.9   Smokeless tobacco: Never  Vaping Use   Vaping  status: Never Used  Substance Use Topics   Alcohol use: Not Currently    Alcohol/week: 2.0 standard drinks of alcohol    Types: 2 Cans of beer per week   Drug use: Never   Family History  Problem Relation Age of Onset   Asthma Mother    Cancer Mother    Heart disease Father    Cancer Sister    Cancer Brother    Heart disease Brother    Allergies  Allergen Reactions   Morphine Other (See Comments)    Lowers blood pressure   Pentazocine     Drops blood pressure   Patient Care Team: Billie Lade, MD as PCP - General (Internal Medicine)   Outpatient Medications Prior to Visit  Medication Sig   albuterol (VENTOLIN HFA) 108 (90 Base) MCG/ACT inhaler Inhale 2 puffs into the lungs every 4 (four) hours as needed for wheezing or shortness of breath.   No facility-administered medications prior to visit.   Review of Systems  Constitutional:  Negative for chills and fever.  HENT:  Negative for sore throat.   Respiratory:  Negative for cough and shortness of breath.   Cardiovascular:  Negative for chest pain, palpitations and leg swelling.  Gastrointestinal:  Negative for abdominal pain, blood in stool,  constipation, diarrhea, nausea and vomiting.  Genitourinary:  Negative for dysuria and hematuria.  Musculoskeletal:  Negative for myalgias.  Skin:  Negative for itching and rash.  Neurological:  Negative for dizziness and headaches.  Psychiatric/Behavioral:  Negative for depression and suicidal ideas.        Objective:     BP (!) 142/77 (BP Location: Right Arm, Patient Position: Sitting, Cuff Size: Normal)   Pulse 67   Ht 5\' 11"  (1.803 m)   Wt 228 lb 9.6 oz (103.7 kg)   SpO2 97%   BMI 31.88 kg/m  BP Readings from Last 3 Encounters:  05/19/23 (!) 142/77  03/15/23 133/78  09/08/22 127/76   Physical Exam Vitals reviewed.  Constitutional:      General: He is not in acute distress.    Appearance: Normal appearance. He is obese. He is not ill-appearing.  HENT:      Head: Normocephalic and atraumatic.     Right Ear: External ear normal.     Left Ear: External ear normal.     Nose: Nose normal. No congestion or rhinorrhea.     Mouth/Throat:     Mouth: Mucous membranes are moist.     Pharynx: Oropharynx is clear.  Eyes:     General: No scleral icterus.    Extraocular Movements: Extraocular movements intact.     Conjunctiva/sclera: Conjunctivae normal.     Pupils: Pupils are equal, round, and reactive to light.  Cardiovascular:     Rate and Rhythm: Normal rate and regular rhythm.     Pulses: Normal pulses.     Heart sounds: Normal heart sounds. No murmur heard. Pulmonary:     Effort: Pulmonary effort is normal.     Breath sounds: Normal breath sounds. No wheezing, rhonchi or rales.  Abdominal:     General: Abdomen is flat. Bowel sounds are normal. There is no distension.     Palpations: Abdomen is soft.     Tenderness: There is no abdominal tenderness.     Hernia: A hernia (reducible ventral hernia) is present.  Musculoskeletal:        General: No swelling or deformity. Normal range of motion.     Cervical back: Normal range of motion.  Skin:    General: Skin is warm and dry.     Capillary Refill: Capillary refill takes less than 2 seconds.  Neurological:     General: No focal deficit present.     Mental Status: He is alert and oriented to person, place, and time.     Motor: No weakness.  Psychiatric:        Mood and Affect: Mood normal.        Behavior: Behavior normal.        Thought Content: Thought content normal.   Last CBC Lab Results  Component Value Date   WBC 4.9 09/09/2022   HGB 15.3 09/09/2022   HCT 46.3 09/09/2022   MCV 94 09/09/2022   MCH 31.1 09/09/2022   RDW 12.4 09/09/2022   PLT 159 09/09/2022   Last metabolic panel Lab Results  Component Value Date   GLUCOSE 89 03/15/2023   NA 141 03/15/2023   K 4.1 03/15/2023   CL 104 03/15/2023   CO2 20 03/15/2023   BUN 18 03/15/2023   CREATININE 0.92 03/15/2023   EGFR  94 03/15/2023   CALCIUM 8.9 03/15/2023   PROT 5.9 (L) 09/09/2022   ALBUMIN 4.2 09/09/2022   LABGLOB 1.7 09/09/2022   AGRATIO 2.5 (H) 09/09/2022  BILITOT 0.5 09/09/2022   ALKPHOS 94 09/09/2022   AST 22 09/09/2022   ALT 24 09/09/2022   ANIONGAP 8 02/17/2021   Last lipids Lab Results  Component Value Date   CHOL 186 09/09/2022   HDL 53 09/09/2022   LDLCALC 121 (H) 09/09/2022   TRIG 65 09/09/2022   CHOLHDL 3.5 09/09/2022   Last hemoglobin A1c Lab Results  Component Value Date   HGBA1C 6.3 (H) 09/09/2022   Last thyroid functions Lab Results  Component Value Date   TSH 3.140 09/09/2022   Last vitamin D Lab Results  Component Value Date   VD25OH 23.8 (L) 09/09/2022   Last vitamin B12 and Folate Lab Results  Component Value Date   VITAMINB12 327 09/09/2022   FOLATE 5.5 09/09/2022        Assessment & Plan:    Routine Health Maintenance and Physical Exam  Immunization History  Administered Date(s) Administered   Influenza Inj Mdck Quad Pf 05/06/2020   Influenza,inj,Quad PF,6+ Mos 04/16/2018, 03/06/2019   Influenza-Unspecified 05/07/2014, 04/14/2015, 02/25/2016, 05/06/2020   Moderna Sars-Covid-2 Vaccination 03/03/2020, 03/31/2020   Tdap 09/08/2022    Health Maintenance  Topic Date Due   FOOT EXAM  Never done   Diabetic kidney evaluation - Urine ACR  Never done   Zoster Vaccines- Shingrix (1 of 2) Never done   COVID-19 Vaccine (3 - 2024-25 season) 02/05/2023   HEMOGLOBIN A1C  03/11/2023   INFLUENZA VACCINE  09/04/2023 (Originally 01/05/2023)   OPHTHALMOLOGY EXAM  01/27/2024   Diabetic kidney evaluation - eGFR measurement  03/14/2024   Colonoscopy  06/24/2025   DTaP/Tdap/Td (2 - Td or Tdap) 09/07/2032   Hepatitis C Screening  Completed   HIV Screening  Completed   HPV VACCINES  Aged Out    Discussed health benefits of physical activity, and encouraged him to engage in regular exercise appropriate for his age and condition.  Problem List Items Addressed  This Visit       Type 2 diabetes mellitus without complications (HCC) - Primary   New diagnosis.  Meeting criteria with A1c 6.6 on outside labs from September.  He previously requested to discontinue metformin due to fatigue. -Through shared decision making, Mr. Cdebaca will continue to focus on a dietary approach to controlling diabetes. -Starting statin and ACEi -Repeat A1c and urine microalbumin/creatinine ratio ordered today      Hyperlipidemia associated with type 2 diabetes mellitus (HCC)   LDL 157 on outside labs from September.  Repeat lipid panel ordered today.  Start atorvastatin 20 mg daily.      Encounter for well adult exam with abnormal findings   Presenting today for annual physical.  Previous records and labs reviewed. -Repeat labs ordered today -Starting atorvastatin and lisinopril in the setting of diabetes mellitus -Preventative care items are largely up-to-date -We will tentatively plan for follow-up in 6 months      Return in about 6 months (around 11/17/2023).  Billie Lade, MD

## 2023-05-19 NOTE — Assessment & Plan Note (Signed)
New diagnosis.  Meeting criteria with A1c 6.6 on outside labs from September.  He previously requested to discontinue metformin due to fatigue. -Through shared decision making, Mr. Niemela will continue to focus on a dietary approach to controlling diabetes. -Starting statin and ACEi -Repeat A1c and urine microalbumin/creatinine ratio ordered today

## 2023-05-19 NOTE — Assessment & Plan Note (Signed)
Presenting today for annual physical.  Previous records and labs reviewed. -Repeat labs ordered today -Starting atorvastatin and lisinopril in the setting of diabetes mellitus -Preventative care items are largely up-to-date -We will tentatively plan for follow-up in 6 months

## 2023-05-19 NOTE — Assessment & Plan Note (Signed)
LDL 157 on outside labs from September.  Repeat lipid panel ordered today.  Start atorvastatin 20 mg daily.

## 2023-05-19 NOTE — Patient Instructions (Signed)
It was a pleasure to see you today.  Thank you for giving [place] the opportunity to be involved in your care.  Below is a brief recap of your visit and next steps.  We will plan to see you again in 6 months.  Summary Annual physical completed today Start atorvastatin and lisinopril Repeat labs ordered Follow up in 6 months

## 2023-05-20 ENCOUNTER — Other Ambulatory Visit: Payer: Self-pay | Admitting: Internal Medicine

## 2023-05-20 DIAGNOSIS — E559 Vitamin D deficiency, unspecified: Secondary | ICD-10-CM

## 2023-05-20 MED ORDER — VITAMIN D (ERGOCALCIFEROL) 1.25 MG (50000 UNIT) PO CAPS: 50000.0000 [IU] | ORAL_CAPSULE | ORAL | 0 refills | Status: DC

## 2023-05-22 LAB — CBC WITH DIFFERENTIAL/PLATELET
Basophils Absolute: 0.1 10*3/uL (ref 0.0–0.2)
Basos: 1 %
EOS (ABSOLUTE): 0.3 10*3/uL (ref 0.0–0.4)
Eos: 4 %
Hematocrit: 46.8 % (ref 37.5–51.0)
Hemoglobin: 15.6 g/dL (ref 13.0–17.7)
Immature Grans (Abs): 0 10*3/uL (ref 0.0–0.1)
Immature Granulocytes: 0 %
Lymphocytes Absolute: 2.5 10*3/uL (ref 0.7–3.1)
Lymphs: 34 %
MCH: 31.8 pg (ref 26.6–33.0)
MCHC: 33.3 g/dL (ref 31.5–35.7)
MCV: 95 fL (ref 79–97)
Monocytes Absolute: 0.7 10*3/uL (ref 0.1–0.9)
Monocytes: 9 %
Neutrophils Absolute: 3.9 10*3/uL (ref 1.4–7.0)
Neutrophils: 52 %
Platelets: 196 10*3/uL (ref 150–450)
RBC: 4.91 x10E6/uL (ref 4.14–5.80)
RDW: 11.6 % (ref 11.6–15.4)
WBC: 7.4 10*3/uL (ref 3.4–10.8)

## 2023-05-22 LAB — CMP14+EGFR
ALT: 28 [IU]/L (ref 0–44)
AST: 41 [IU]/L — ABNORMAL HIGH (ref 0–40)
Albumin: 4.5 g/dL (ref 3.9–4.9)
Alkaline Phosphatase: 109 [IU]/L (ref 44–121)
BUN/Creatinine Ratio: 17 (ref 10–24)
BUN: 18 mg/dL (ref 8–27)
Bilirubin Total: 0.7 mg/dL (ref 0.0–1.2)
CO2: 25 mmol/L (ref 20–29)
Calcium: 9.2 mg/dL (ref 8.6–10.2)
Chloride: 101 mmol/L (ref 96–106)
Creatinine, Ser: 1.05 mg/dL (ref 0.76–1.27)
Globulin, Total: 2.4 g/dL (ref 1.5–4.5)
Glucose: 110 mg/dL — ABNORMAL HIGH (ref 70–99)
Potassium: 4.7 mmol/L (ref 3.5–5.2)
Sodium: 140 mmol/L (ref 134–144)
Total Protein: 6.9 g/dL (ref 6.0–8.5)
eGFR: 80 mL/min/{1.73_m2} (ref >59)

## 2023-05-22 LAB — LIPID PANEL
Chol/HDL Ratio: 3.9 {ratio} (ref 0.0–5.0)
Cholesterol, Total: 229 mg/dL — ABNORMAL HIGH (ref 100–199)
HDL: 59 mg/dL (ref >39)
LDL Chol Calc (NIH): 155 mg/dL — ABNORMAL HIGH (ref 0–99)
Triglycerides: 83 mg/dL (ref 0–149)
VLDL Cholesterol Cal: 15 mg/dL (ref 5–40)

## 2023-05-22 LAB — HEMOGLOBIN A1C
Est. average glucose Bld gHb Est-mCnc: 131 mg/dL
Hgb A1c MFr Bld: 6.2 % — ABNORMAL HIGH (ref 4.8–5.6)

## 2023-05-22 LAB — MICROALBUMIN / CREATININE URINE RATIO
Creatinine, Urine: 190.6 mg/dL
Microalb/Creat Ratio: 4 mg/g{creat} (ref 0–29)
Microalbumin, Urine: 8.2 ug/mL

## 2023-05-22 LAB — B12 AND FOLATE PANEL
Folate: 8.8 ng/mL (ref >3.0)
Vitamin B-12: 404 pg/mL (ref 232–1245)

## 2023-05-22 LAB — TSH+FREE T4
Free T4: 1.16 ng/dL (ref 0.82–1.77)
TSH: 2.24 u[IU]/mL (ref 0.450–4.500)

## 2023-05-22 LAB — VITAMIN D 25 HYDROXY (VIT D DEFICIENCY, FRACTURES): Vit D, 25-Hydroxy: 20.9 ng/mL — ABNORMAL LOW (ref 30.0–100.0)

## 2023-07-06 ENCOUNTER — Ambulatory Visit: Payer: Self-pay | Admitting: Internal Medicine

## 2023-07-06 NOTE — Telephone Encounter (Signed)
Copied from CRM [id number]. Topic: Clinical - Red Word Triage >> Jul 06, 2023  3:20 PM Suzette B wrote: [facility] that prompted transfer to Nurse Triage: SOB he's taking 6 pumps of inhaler since 4am  Chief Complaint: SOB Symptoms: Runny nose, dry cough Frequency: Since this morning Pertinent Negatives: Patient denies fever and chest pain Disposition: [] ED /[x] Urgent Care (no appt availability in office) / [x] Appointment(In office/virtual)/ []  [place] [facility]/ [] Home Care/ [x] Refused Recommended Disposition /[] [place] [facility]/ []  Follow-up with PCP Additional Notes: Patient called from the nurse's office at work to report SOB. Patient stated that the SOB started after he walked from the parking garage into work this morning. Patient stated that the SOB is present when he is moving around, but denied SOB at rest. Patient able to speak in complete sentences while on the phone with this RN. No audible wheezing or labored breathing while on the phone with this RN. Patient's O2 saturation is 95 while on the phone with this RN. Patient has a history of asthma and COPD and stated that he had to use his albuterol inhaler 3 times today. This RN advised patient that he needs to see a provider within 4 hours. Patient stated that he only wanted to see Dr. Durwin Nora. No availability with Dr. Durwin Nora today or in the clinic. Advised patient to go to UC. Patient refused disposition requested to see his PCP. This RN advised that I would route this conversation to the clinic for their discretion. In addition, the nurse in the office at work stated that the patient would need a doctor's note in order to return to work.   Reason for Disposition  [1] MILD difficulty breathing (e.g., minimal/no SOB at rest, SOB with walking, pulse <100) AND [2] NEW-onset or WORSE than normal  Answer Assessment - Initial Assessment Questions 1. RESPIRATORY STATUS: "Describe your breathing?" (e.g., wheezing, shortness of breath, unable  to speak, severe coughing)      States he has shortness of breath when moving around  2. ONSET: "When did this breathing problem begin?"      This morning after walking from parking garage into work  3. PATTERN "Does the difficult breathing come and go, or has it been constant since it started?"      Comes and goes, present when moving around  4. SEVERITY: "How bad is your breathing?" (e.g., mild, moderate, severe)    - MILD: No SOB at rest, mild SOB with walking, speaks normally in sentences, can lie down, no retractions, pulse < 100.    - MODERATE: SOB at rest, SOB with minimal exertion and prefers to sit, cannot lie down flat, speaks in phrases, mild retractions, audible wheezing, pulse 100-120.    - SEVERE: Very SOB at rest, speaks in single words, struggling to breathe, sitting hunched forward, retractions, pulse > 120      Mild- denies SOB at rest, able to speak in complete sentences while on the phone with this RN, no audible wheezing or labored breathing heard by this RN  5. RECURRENT SYMPTOM: "Have you had difficulty breathing before?" If Yes, ask: "When was the last time?" and "What happened that time?"      States he had an episode like this a few years ago  6. CARDIAC HISTORY: "Do you have any history of heart disease?" (e.g., heart attack, angina, bypass surgery, angioplasty)      Denies  7. LUNG HISTORY: "Do you have any history of lung disease?"  (e.g.,  pulmonary embolus, asthma, emphysema)     History of asthma and COPD  8. CAUSE: "What do you think is causing the breathing problem?"      States his brother is sick, but he has not been around him a lot  9. OTHER SYMPTOMS: "Do you have any other symptoms? (e.g., dizziness, runny nose, cough, chest pain, fever)     Runny nose, states he coughs when his throat gets dry, denies chest pain, denies fever  10. O2 SATURATION MONITOR:  "Do you use an oxygen saturation monitor (pulse oximeter) at home?" If Yes, ask: "What is your  reading (oxygen level) today?" "What is your usual oxygen saturation reading?" (e.g., 95%)     O2 saturation 95 on room air in the nurse's office at work  Protocols used: Breathing Difficulty-A-AH

## 2023-07-07 ENCOUNTER — Ambulatory Visit: Payer: Self-pay | Admitting: Family Medicine

## 2023-07-07 ENCOUNTER — Encounter: Payer: Self-pay | Admitting: Internal Medicine

## 2023-07-07 ENCOUNTER — Ambulatory Visit: Payer: 59 | Admitting: Internal Medicine

## 2023-07-07 VITALS — BP 109/70 | HR 63 | Ht 71.0 in | Wt 225.6 lb

## 2023-07-07 DIAGNOSIS — B9689 Other specified bacterial agents as the cause of diseases classified elsewhere: Secondary | ICD-10-CM | POA: Diagnosis not present

## 2023-07-07 DIAGNOSIS — J019 Acute sinusitis, unspecified: Secondary | ICD-10-CM | POA: Diagnosis not present

## 2023-07-07 MED ORDER — CETIRIZINE HCL 10 MG PO TABS: 10.0000 mg | ORAL_TABLET | Freq: Every day | ORAL | 11 refills | Status: AC

## 2023-07-07 MED ORDER — AZITHROMYCIN 250 MG PO TABS: ORAL_TABLET | ORAL | 0 refills | Status: DC

## 2023-07-07 MED ORDER — FLUTICASONE PROPIONATE 50 MCG/ACT NA SUSP: 2.0000 | Freq: Every day | NASAL | 6 refills | Status: AC

## 2023-07-07 NOTE — Patient Instructions (Signed)
It was a pleasure to see you today.  Thank you for giving [place] the opportunity to be involved in your care.  Below is a brief recap of your visit and next steps.   Summary Z pack prescribed today to cover for bacterial infection As discussed I recommend using a nasal saline rinse followed by fluticasone nasal spray Take cetirizine 10 mg daily Follow up if symptoms not improving

## 2023-07-07 NOTE — Assessment & Plan Note (Signed)
Presenting today for an acute visit for what is listed as shortness of breath, however on further questioning is better described as nasal and sinus congestion.  Symptoms have worsened over the last week.  Nasal secretions are clear.  No significant maxillary or frontal sinus tenderness.  Denies cough or sputum production.  Pulmonary exam is unremarkable.  O2 sat 98% on intake.  I suspect his perceived shortness of breath is due to difficulty breathing through his nose because of congestion.  He states that symptoms improve with breathing through his mouth. -Treatment options reviewed.  Z-Pak prescribed for antibiotic coverage.  I recommended regular use of a nasal saline rinse followed by fluticasone nasal spray.  He was also prescribed cetirizine 10 mg daily.  Patient will return to care if symptoms worsen or fail to improve.

## 2023-07-07 NOTE — Progress Notes (Signed)
Acute Office Visit  Subjective:     Patient ID: Anthony [name], male    DOB: 08/29/1960, 63 y.o.   MRN: [id number]  Chief Complaint  Patient presents with   Shortness of Breath    Patient states since yesterday he is having trouble breathing , can't catch his breath. He went and seen the nurse at his job and she took his oxygen stats, stayed in the 90-100 range.   Anthony [name] presents today for an acute visit endorsing shortness of breath and sinus congestion.  He describes difficulty breathing through his nose when ambulating from his car into work.  He was evaluated by employee health at work yesterday.  Vitals were reassuring.  O2 sats remained above 95%.  His chief concern is nasal/sinus congestion.  Denies sinus pain/pressure, fever/chills, cough or sputum production, and nausea/vomiting and diarrhea.  He describes having a cold last month.  Symptoms have mostly resolved, however more recently sinus and nasal congestion has worsened.  He has tried using his albuterol inhaler for symptom relief without significant improvement.  He is unaware of any recent sick contacts.  Review of Systems  Constitutional:  Negative for chills, fever and malaise/fatigue.  HENT:  Positive for congestion. Negative for sinus pain and sore throat.   Respiratory:  Positive for shortness of breath. Negative for cough and sputum production.   Cardiovascular:  Negative for chest pain.  Gastrointestinal:  Negative for abdominal pain, diarrhea, nausea and vomiting.  Musculoskeletal:  Negative for myalgias.      Objective:    BP 109/70 (BP Location: Right Arm, Patient Position: Sitting, Cuff Size: Large)   Pulse 63   Ht 5\' 11"  (1.803 m)   Wt 225 lb 9.6 oz (102.3 kg)   SpO2 98%   BMI 31.46 kg/m   Physical Exam Vitals reviewed.  Constitutional:      General: He is not in acute distress.    Appearance: Normal appearance. He is obese. He is not ill-appearing.  HENT:     Head: Normocephalic and  atraumatic.     Right Ear: External ear normal.     Left Ear: External ear normal.     Nose: Congestion and rhinorrhea present.     Mouth/Throat:     Mouth: Mucous membranes are moist.     Pharynx: Oropharynx is clear.  Eyes:     General: No scleral icterus.    Extraocular Movements: Extraocular movements intact.     Conjunctiva/sclera: Conjunctivae normal.     Pupils: Pupils are equal, round, and reactive to light.  Cardiovascular:     Rate and Rhythm: Normal rate and regular rhythm.     Pulses: Normal pulses.     Heart sounds: Normal heart sounds. No murmur heard. Pulmonary:     Effort: Pulmonary effort is normal.     Breath sounds: Normal breath sounds. No wheezing, rhonchi or rales.  Abdominal:     General: Abdomen is flat. Bowel sounds are normal. There is no distension.     Palpations: Abdomen is soft.     Tenderness: There is no abdominal tenderness.     Hernia: A hernia (reducible ventral hernia) is present.  Musculoskeletal:        General: No swelling or deformity. Normal range of motion.     Cervical back: Normal range of motion.  Skin:    General: Skin is warm and dry.     Capillary Refill: Capillary refill takes less than 2 seconds.  Neurological:  General: No focal deficit present.     Mental Status: He is alert and oriented to person, place, and time.     Motor: No weakness.  Psychiatric:        Mood and Affect: Mood normal.        Behavior: Behavior normal.        Thought Content: Thought content normal.       Assessment & Plan:   Problem List Items Addressed This Visit       Acute bacterial rhinosinusitis - Primary   Presenting today for an acute visit for what is listed as shortness of breath, however on further questioning is better described as nasal and sinus congestion.  Symptoms have worsened over the last week.  Nasal secretions are clear.  No significant maxillary or frontal sinus tenderness.  Denies cough or sputum production.  Pulmonary  exam is unremarkable.  O2 sat 98% on intake.  I suspect his perceived shortness of breath is due to difficulty breathing through his nose because of congestion.  He states that symptoms improve with breathing through his mouth. -Treatment options reviewed.  Z-Pak prescribed for antibiotic coverage.  I recommended regular use of a nasal saline rinse followed by fluticasone nasal spray.  He was also prescribed cetirizine 10 mg daily.  Patient will return to care if symptoms worsen or fail to improve.      Meds ordered this encounter  Medications   azithromycin (ZITHROMAX Z-PAK) 250 MG tablet    Sig: Take 2 tablets (500 mg) PO today, then 1 tablet (250 mg) PO daily x4 days.    Dispense:  6 tablet    Refill:  0   fluticasone (FLONASE) 50 MCG/ACT nasal spray    Sig: Place 2 sprays into both nostrils daily.    Dispense:  16 g    Refill:  6   cetirizine (ZYRTEC) 10 MG tablet    Sig: Take 1 tablet (10 mg total) by mouth daily.    Dispense:  30 tablet    Refill:  11    Return if symptoms worsen or fail to improve.  Billie Lade, MD

## 2023-08-07 ENCOUNTER — Telehealth: Payer: Self-pay | Admitting: Internal Medicine

## 2023-08-07 ENCOUNTER — Ambulatory Visit: Payer: Self-pay | Admitting: Internal Medicine

## 2023-08-07 ENCOUNTER — Other Ambulatory Visit: Payer: Self-pay | Admitting: Internal Medicine

## 2023-08-07 ENCOUNTER — Ambulatory Visit: Admitting: Family Medicine

## 2023-08-07 VITALS — BP 128/82 | HR 76 | Temp 98.2°F | Ht 71.0 in | Wt 223.0 lb

## 2023-08-07 DIAGNOSIS — R062 Wheezing: Secondary | ICD-10-CM | POA: Diagnosis not present

## 2023-08-07 DIAGNOSIS — E559 Vitamin D deficiency, unspecified: Secondary | ICD-10-CM

## 2023-08-07 MED ORDER — BUDESONIDE-FORMOTEROL FUMARATE 160-4.5 MCG/ACT IN AERO: 2.0000 | INHALATION_SPRAY | Freq: Two times a day (BID) | RESPIRATORY_TRACT | 3 refills | Status: DC

## 2023-08-07 MED ORDER — ALBUTEROL SULFATE HFA 108 (90 BASE) MCG/ACT IN AERS: 2.0000 | INHALATION_SPRAY | Freq: Four times a day (QID) | RESPIRATORY_TRACT | 0 refills | Status: AC | PRN

## 2023-08-07 MED ORDER — PREDNISONE 50 MG PO TABS
50.0000 mg | ORAL_TABLET | Freq: Every day | ORAL | 0 refills | Status: AC
Start: 2023-08-07 — End: 2023-08-12

## 2023-08-07 NOTE — Telephone Encounter (Unsigned)
 Copied from CRM [id number]. Topic: Clinical - Red Word Triage >> Aug 07, 2023  8:05 AM Marland Kitchen D wrote: [facility] Word that prompted transfer to Nurse Triage: Patient is having a hard time breathing. He said he'd been up half the night wheezing.

## 2023-08-07 NOTE — Telephone Encounter (Signed)
 Red Word that prompted transfer to Nurse Triage: Patient is having a hard time breathing and has been up all night wheezing.     Chief Complaint: Cough with wheezing Symptoms: Above Frequency: 1-2 weeks ago Pertinent Negatives: Patient denies fever Disposition: [] ED /[] Urgent Care (no appt availability in office) / [x] Appointment(In office/virtual)/ []  [place] [facility]/ [] Home Care/ [] Refused Recommended Disposition /[] [place] [facility]/ []  Follow-up with PCP Additional Notes: Agee's with appointment.  Reason for Disposition  [1] MILD difficulty breathing (e.g., minimal/no SOB at rest, SOB with walking, pulse <100) AND [2] NEW-onset or WORSE than normal  Answer Assessment - Initial Assessment Questions 1. RESPIRATORY STATUS: "Describe your breathing?" (e.g., wheezing, shortness of breath, unable to speak, severe coughing)      Wheezing 2. ONSET: "When did this breathing problem begin?"      Cough - 2 weeks ago 3. PATTERN "Does the difficult breathing come and go, or has it been constant since it started?"      Comes and goes 4. SEVERITY: "How bad is your breathing?" (e.g., mild, moderate, severe)    - MILD: No SOB at rest, mild SOB with walking, speaks normally in sentences, can lie down, no retractions, pulse < 100.    - MODERATE: SOB at rest, SOB with minimal exertion and prefers to sit, cannot lie down flat, speaks in phrases, mild retractions, audible wheezing, pulse 100-120.    - SEVERE: Very SOB at rest, speaks in single words, struggling to breathe, sitting hunched forward, retractions, pulse > 120      Mild 5. RECURRENT SYMPTOM: "Have you had difficulty breathing before?" If Yes, ask: "When was the last time?" and "What happened that time?"      Yes 6. CARDIAC HISTORY: "Do you have any history of heart disease?" (e.g., heart attack, angina, bypass surgery, angioplasty)      No 7. LUNG HISTORY: "Do you have any history of lung disease?"  (e.g., pulmonary embolus,  asthma, emphysema)     COPD 8. CAUSE: "What do you think is causing the breathing problem?"      unsure 9. OTHER SYMPTOMS: "Do you have any other symptoms? (e.g., dizziness, runny nose, cough, chest pain, fever)     Wheezing 10. O2 SATURATION MONITOR:  "Do you use an oxygen saturation monitor (pulse oximeter) at home?" If Yes, ask: "What is your reading (oxygen level) today?" "What is your usual oxygen saturation reading?" (e.g., 95%)       No 11. PREGNANCY: "Is there any chance you are pregnant?" "When was your last menstrual period?"       N/a 12. TRAVEL: "Have you traveled out of the country in the last month?" (e.g., travel history, exposures)       No  Protocols used: Breathing Difficulty-A-AH

## 2023-08-07 NOTE — Assessment & Plan Note (Signed)
 Albuterol as directed.  Restarting Symbicort.  Brief course of prednisone.  Last A1c 6.2.

## 2023-08-07 NOTE — Patient Instructions (Signed)
Medications as prescribed. ° °Take care ° °Dr. Allon Costlow  °

## 2023-08-07 NOTE — Progress Notes (Signed)
 Subjective:  Patient ID: Anthony [name], male    DOB: Oct 18, 1960  Age: 63 y.o. MRN: [id number]  CC:   Chief Complaint  Patient presents with   Cough    Mostly a dry cough with wheezing for A couple of weeks - has seen pulmonologist     HPI:  63 year old male presents for evaluation of the above.  Patient has previously been evaluated by pulmonology.  Has had pulmonary function test.  Pulmonology felt that they were overall quite well.  He has been on Symbicort in the past.  Patient reports that over the past month he has had ongoing cough.  Worse at night.  Worse with lying down.  Associated wheezing.  Has been using an expired albuterol inhaler.  He is requesting a refill on this.  No fever.  Patient Active Problem List   Diagnosis Date Noted   Wheezing 08/07/2023   Encounter for well adult exam with abnormal findings 05/19/2023   Type 2 diabetes mellitus without complications (HCC) 03/15/2023   Hyperlipidemia associated with type 2 diabetes mellitus (HCC) 03/15/2023   Vitamin D insufficiency 03/15/2023   History of gastroesophageal reflux (GERD) 09/08/2022   Chronic low back pain 09/08/2022   Obesity (BMI 30.0-34.9) 09/08/2022   Ventral hernia 09/08/2022   Snoring 05/12/2021   DOE (dyspnea on exertion) 03/02/2021   Coronary artery calcification 02/17/2021    Social Hx   Social History   Socioeconomic History   Marital status: Married    Spouse name: Not on file   Number of children: Not on file   Years of education: Not on file   Highest education level: 12th grade  Occupational History   Not on file  Tobacco Use   Smoking status: Former    Current packs/day: 0.00    Average packs/day: 0.3 packs/day for 1 year (0.3 ttl pk-yrs)    Types: Cigarettes    Start date: 66    Quit date: 1984    Years since quitting: 41.1   Smokeless tobacco: Never  Vaping Use   Vaping status: Never Used  Substance and Sexual Activity   Alcohol use: Not Currently     Alcohol/week: 2.0 standard drinks of alcohol    Types: 2 Cans of beer per week   Drug use: Never   Sexual activity: Yes    Comment: married  Other Topics Concern   Not on file  Social History Narrative   Not on file   Social Drivers of Health   Financial Resource Strain: Patient Declined (05/18/2023)   Overall Financial Resource Strain (CARDIA)    Difficulty of Paying Living Expenses: Patient declined  Food Insecurity: Patient Declined (05/18/2023)   Hunger Vital Sign    Worried About Running Out of Food in the Last Year: Patient declined    Ran Out of Food in the Last Year: Patient declined  Transportation Needs: No Transportation Needs (05/18/2023)   PRAPARE - Administrator, Civil Service (Medical): No    Lack of Transportation (Non-Medical): No  Physical Activity: Sufficiently Active (05/18/2023)   Exercise Vital Sign    Days of Exercise per Week: 5 days    Minutes of Exercise per Session: 30 min  Stress: No Stress Concern Present (05/18/2023)   [employer] of Occupational Health - Occupational Stress Questionnaire    Feeling of Stress : Not at all  Social Connections: Unknown (05/18/2023)   Social Connection and Isolation Panel [NHANES]    Frequency of Communication with  Friends and Family: More than three times a week    Frequency of Social Gatherings with Friends and Family: More than three times a week    Attends Religious Services: Patient declined    Database administrator or Organizations: Patient declined    Attends Engineer, structural: Not on file    Marital Status: Married    Review of Systems Per HPI  Objective:  BP 128/82   Pulse 76   Temp 98.2 F (36.8 C)   Ht 5\' 11"  (1.803 m)   Wt 223 lb (101.2 kg)   SpO2 96%   BMI 31.10 kg/m      08/07/2023    2:49 PM 07/07/2023   11:12 AM 05/19/2023    1:00 PM  BP/Weight  Systolic BP 128 109 142  Diastolic BP 82 70 77  Wt. (Lbs) 223 225.6 228.6  BMI 31.1 kg/m2 31.46 kg/m2 31.88  kg/m2    Physical Exam Vitals and nursing note reviewed.  Constitutional:      General: He is not in acute distress.    Appearance: Normal appearance.  Eyes:     General:        Right eye: No discharge.        Left eye: No discharge.     Conjunctiva/sclera: Conjunctivae normal.  Cardiovascular:     Rate and Rhythm: Normal rate and regular rhythm.  Pulmonary:     Effort: Pulmonary effort is normal. No respiratory distress.     Breath sounds: Wheezing present.  Neurological:     Mental Status: He is alert.  Psychiatric:        Mood and Affect: Mood normal.        Behavior: Behavior normal.     Lab Results  Component Value Date   WBC 7.4 05/19/2023   HGB 15.6 05/19/2023   HCT 46.8 05/19/2023   PLT 196 05/19/2023   GLUCOSE 110 (H) 05/19/2023   CHOL 229 (H) 05/19/2023   TRIG 83 05/19/2023   HDL 59 05/19/2023   LDLCALC 155 (H) 05/19/2023   ALT 28 05/19/2023   AST 41 (H) 05/19/2023   NA 140 05/19/2023   K 4.7 05/19/2023   CL 101 05/19/2023   CREATININE 1.05 05/19/2023   BUN 18 05/19/2023   CO2 25 05/19/2023   TSH 2.240 05/19/2023   HGBA1C 6.2 (H) 05/19/2023     Assessment & Plan:  Wheezing Assessment & Plan: Albuterol as directed.  Restarting Symbicort.  Brief course of prednisone.  Last A1c 6.2.   Other orders -     predniSONE; Take 1 tablet (50 mg total) by mouth daily for 5 days.  Dispense: 5 tablet; Refill: 0 -     Budesonide-Formoterol Fumarate; Inhale 2 puffs into the lungs 2 (two) times daily.  Dispense: 1 each; Refill: 3 -     Albuterol Sulfate HFA; Inhale 2 puffs into the lungs every 6 (six) hours as needed for wheezing or shortness of breath.  Dispense: 1 each; Refill: 0    Follow-up: Follow-up with PCP  Everlene Other DO [facility]

## 2023-08-07 NOTE — Telephone Encounter (Unsigned)
 Copied from CRM [id number]. Topic: Clinical - Red Word Triage >> Aug 07, 2023  8:14 AM Marland Kitchen D wrote: Red Word that prompted transfer to Nurse Triage: Patient is having a hard time breathing and has been up all night wheezing.

## 2023-08-29 ENCOUNTER — Other Ambulatory Visit: Payer: Self-pay | Admitting: Family Medicine

## 2023-09-01 ENCOUNTER — Telehealth: Payer: Self-pay | Admitting: Internal Medicine

## 2023-09-01 NOTE — Telephone Encounter (Signed)
 Unable to add to previous telephone note- LVM for pt to call back and schedule follow up appointment

## 2023-09-01 NOTE — Telephone Encounter (Signed)
 E2C2 didn't schedule. Needs appt with available provider

## 2023-10-27 ENCOUNTER — Other Ambulatory Visit: Payer: Self-pay | Admitting: Internal Medicine

## 2023-10-27 DIAGNOSIS — E559 Vitamin D deficiency, unspecified: Secondary | ICD-10-CM

## 2023-12-10 ENCOUNTER — Other Ambulatory Visit: Payer: Self-pay | Admitting: Family Medicine

## 2024-01-22 ENCOUNTER — Other Ambulatory Visit: Payer: Self-pay | Admitting: Internal Medicine

## 2024-01-22 DIAGNOSIS — E559 Vitamin D deficiency, unspecified: Secondary | ICD-10-CM

## 2024-02-01 ENCOUNTER — Ambulatory Visit: Admitting: Family Medicine

## 2024-02-01 ENCOUNTER — Encounter: Payer: Self-pay | Admitting: Family Medicine

## 2024-02-01 VITALS — BP 110/68 | HR 71 | Temp 97.9°F | Ht 71.0 in | Wt 231.0 lb

## 2024-02-01 DIAGNOSIS — L720 Epidermal cyst: Secondary | ICD-10-CM | POA: Insufficient documentation

## 2024-02-01 MED ORDER — DOXYCYCLINE HYCLATE 100 MG PO TABS: 100.0000 mg | ORAL_TABLET | Freq: Two times a day (BID) | ORAL | 0 refills | Status: DC

## 2024-02-01 MED ORDER — LIDOCAINE-EPINEPHRINE 1 %-1:100000 IJ SOLN
3.0000 mL | Freq: Once | INTRAMUSCULAR | Status: AC
  Administered 2024-02-01: 3 mL via INTRADERMAL

## 2024-02-01 NOTE — Assessment & Plan Note (Signed)
 Discussed referral to general surgery versus antibiotic therapy while waiting for erythema and induration to improve versus incision and attempted cyst removal in the office today.  Patient elected for the latter. Procedure performed today.  See above.  I was not able to fully remove the cyst sac in the office today.  Wound was left open.  Placed on antibiotic therapy.  Referring to general surgery.

## 2024-02-01 NOTE — Patient Instructions (Signed)
 Keep the area clean.  Referral placed to General surgery.  Antibiotic as prescribed.

## 2024-02-01 NOTE — Progress Notes (Signed)
 Subjective:  Patient ID: Anthony [name], male    DOB: 10/17/1960  Age: 63 y.o. MRN: [id number]  CC:   Chief Complaint  Patient presents with   cyst on neck     HPI:  63 year old male presents for evaluation of the above.  Patient has had a longstanding cyst on his anterior neck.  Recently he shaved his beard and squeezed/manipulated the cyst.  He states that he got a lot of cyst contents out.  However, it subsequently became red and inflamed.  Denies any fever.  He states that he is here today for cyst removal.  Patient Active Problem List   Diagnosis Date Noted   Epidermoid cyst of neck 02/01/2024   Wheezing 08/07/2023   Type 2 diabetes mellitus without complications (HCC) 03/15/2023   Hyperlipidemia associated with type 2 diabetes mellitus (HCC) 03/15/2023   Vitamin D  insufficiency 03/15/2023   History of gastroesophageal reflux (GERD) 09/08/2022   Chronic low back pain 09/08/2022   Obesity (BMI 30.0-34.9) 09/08/2022   Ventral hernia 09/08/2022   Snoring 05/12/2021   DOE (dyspnea on exertion) 03/02/2021   Coronary artery calcification 02/17/2021    Social Hx   Social History   Socioeconomic History   Marital status: Married    Spouse name: Not on file   Number of children: Not on file   Years of education: Not on file   Highest education level: 12th grade  Occupational History   Not on file  Tobacco Use   Smoking status: Former    Current packs/day: 0.00    Average packs/day: 0.3 packs/day for 1 year (0.3 ttl pk-yrs)    Types: Cigarettes    Start date: 51    Quit date: 1984    Years since quitting: 41.6   Smokeless tobacco: Never  Vaping Use   Vaping status: Never Used  Substance and Sexual Activity   Alcohol use: Not Currently    Alcohol/week: 2.0 standard drinks of alcohol    Types: 2 Cans of beer per week   Drug use: Never   Sexual activity: Yes    Comment: married  Other Topics Concern   Not on file  Social History Narrative   Not on file    Social Drivers of Health   Financial Resource Strain: Patient Declined (01/31/2024)   Overall Financial Resource Strain (CARDIA)    Difficulty of Paying Living Expenses: Patient declined  Food Insecurity: Patient Declined (01/31/2024)   Hunger Vital Sign    Worried About Running Out of Food in the Last Year: Patient declined    Ran Out of Food in the Last Year: Patient declined  Transportation Needs: No Transportation Needs (01/31/2024)   PRAPARE - Administrator, Civil Service (Medical): No    Lack of Transportation (Non-Medical): No  Physical Activity: Sufficiently Active (01/31/2024)   Exercise Vital Sign    Days of Exercise per Week: 5 days    Minutes of Exercise per Session: 150+ min  Stress: No Stress Concern Present (01/31/2024)   [employer] of Occupational Health - Occupational Stress Questionnaire    Feeling of Stress: Not at all  Social Connections: Unknown (01/31/2024)   Social Connection and Isolation Panel    Frequency of Communication with Friends and Family: Patient declined    Frequency of Social Gatherings with Friends and Family: Patient declined    Attends Religious Services: Patient declined    Database administrator or Organizations: Patient declined    Attends Banker  Meetings: Not on file    Marital Status: Married    Review of Systems Per HPI  Objective:  BP 110/68   Pulse 71   Temp 97.9 F (36.6 C)   Ht 5' 11 (1.803 m)   Wt 231 lb (104.8 kg)   SpO2 98%   BMI 32.22 kg/m      02/01/2024    8:17 AM 08/07/2023    2:49 PM 07/07/2023   11:12 AM  BP/Weight  Systolic BP 110 128 109  Diastolic BP 68 82 70  Wt. (Lbs) 231 223 225.6  BMI 32.22 kg/m2 31.1 kg/m2 31.46 kg/m2    Physical Exam Vitals and nursing note reviewed.  Constitutional:      General: He is not in acute distress. HENT:     Head: Normocephalic and atraumatic.  Neck:      Comments: Raised, firm lesion at the labeled location.  No current  fluctuance. Pulmonary:     Effort: Pulmonary effort is normal. No respiratory distress.  Neurological:     Mental Status: He is alert.     Lab Results  Component Value Date   WBC 7.4 05/19/2023   HGB 15.6 05/19/2023   HCT 46.8 05/19/2023   PLT 196 05/19/2023   GLUCOSE 110 (H) 05/19/2023   CHOL 229 (H) 05/19/2023   TRIG 83 05/19/2023   HDL 59 05/19/2023   LDLCALC 155 (H) 05/19/2023   ALT 28 05/19/2023   AST 41 (H) 05/19/2023   NA 140 05/19/2023   K 4.7 05/19/2023   CL 101 05/19/2023   CREATININE 1.05 05/19/2023   BUN 18 05/19/2023   CO2 25 05/19/2023   TSH 2.240 05/19/2023   HGBA1C 6.2 (H) 05/19/2023   I & D  Date/Time: 02/01/2024 10:01 AM  Performed by: Evangelia Whitaker G, DO Authorized by: Rawn Quiroa G, DO   Consent:    Consent obtained:  Verbal   Consent given by:  Patient   Alternatives discussed:  Delayed treatment, referral and alternative treatment Location:    Type:  Cyst   Location:  Neck   Neck location:  L anterior Pre-procedure details:    Skin preparation:  Alcohol and povidone-iodine Anesthesia:    Anesthesia method:  Local infiltration   Local anesthetic:  Lidocaine  1% WITH epi Procedure type:    Complexity:  Simple Procedure details:    Needle aspiration: no     Incision types:  Stab incision   Scalpel blade:  11   Techniques: Probed.   Drainage characteristics: Portion of cyst sac removed.   Drainage amount:  Scant   Wound treatment:  Wound left open   Packing materials:  None Post-procedure details:    Procedure completion:  Tolerated well, no immediate complications    Assessment & Plan:  Epidermoid cyst of neck Assessment & Plan: Discussed referral to general surgery versus antibiotic therapy while waiting for erythema and induration to improve versus incision and attempted cyst removal in the office today.  Patient elected for the latter. Procedure performed today.  See above.  I was not able to fully remove the cyst sac in the  office today.  Wound was left open.  Placed on antibiotic therapy.  Referring to general surgery.  Orders: -     Lidocaine -EPINEPHrine  -     Ambulatory referral to General Surgery  Other orders -     Doxycycline  Hyclate; Take 1 tablet (100 mg total) by mouth 2 (two) times daily.  Dispense: 14 tablet; Refill: 0 -  I & D    Follow-up:  Referring to General surgery for followup/evaluation  Jacqulyn Ahle DO [facility]

## 2024-02-13 ENCOUNTER — Ambulatory Visit: Admitting: Surgery

## 2024-02-13 ENCOUNTER — Encounter: Payer: Self-pay | Admitting: Surgery

## 2024-02-13 VITALS — BP 127/81 | HR 61 | Temp 98.1°F | Resp 16 | Ht 71.0 in | Wt 229.0 lb

## 2024-02-13 DIAGNOSIS — L720 Epidermal cyst: Secondary | ICD-10-CM

## 2024-02-13 NOTE — Progress Notes (Signed)
 [facility] History and Physical  Reason for Referral: Neck cyst Referring Physician: Dr. Bluford  Chief Complaint   New Patient (Initial Visit)     Anthony [name] is a 63 y.o. male.  HPI: Patient presents for evaluation of a neck cyst.  He first noticed the cyst about a year ago.  He had a very long beard in place and when he shaved the beard he noticed the cyst was present on his neck.  He tried to pop the area and there was a thick white drainage he was able to extract.  He subsequently started getting inflammation to the area.  He went to his primary care doctor who put him on doxycycline  and performed an incision and drainage of the area.  Dr. Bluford was not convinced that he was able to remove the entire cyst and therefore referred him to me.  He denies any previous issues with this cyst.  He denies any other lumps or bumps anywhere on his body.  His past medical history significant for COPD, asthma, and hypertension.  He denies use of blood thinning medications.  He denies history of previous surgery.  He denies use of tobacco products and illicit drugs.  He occasionally consumes alcohol.  Past Medical History:  Diagnosis Date   Asthma 2020   COPD   COPD (chronic obstructive pulmonary disease) (HCC)    Encounter for general adult medical examination with abnormal findings 09/08/2022   Hyperlipidemia 03/15/2023   Type 2 diabetes mellitus without complications (HCC) 03/15/2023    Past Surgical History:  Procedure Laterality Date   RIGHT/LEFT HEART CATH AND CORONARY ANGIOGRAPHY N/A 03/02/2021   Procedure: RIGHT/LEFT HEART CATH AND CORONARY ANGIOGRAPHY;  Surgeon: Claudene Victory ORN, MD;  Location: [facility] INVASIVE [facility];  Service: Cardiovascular;  Laterality: N/A;   SPINE SURGERY  2017 or 2018   lower back    Family History  Problem Relation Age of Onset   Asthma Mother    Cancer Mother    Heart disease Father    Cancer Sister    Cancer Brother    Heart disease Brother      Social History   Tobacco Use   Smoking status: Former    Current packs/day: 0.00    Average packs/day: 0.3 packs/day for 1 year (0.3 ttl pk-yrs)    Types: Cigarettes    Start date: 32    Quit date: 1984    Years since quitting: 41.7   Smokeless tobacco: Never  Vaping Use   Vaping status: Never Used  Substance Use Topics   Alcohol use: Not Currently    Alcohol/week: 2.0 standard drinks of alcohol    Types: 2 Cans of beer per week   Drug use: Never    Medications: I have reviewed the patient's current medications. Allergies as of 02/13/2024       Reactions   Morphine Other (See Comments)   Lowers blood pressure   Pentazocine    Drops blood pressure        Medication List        Accurate as of February 13, 2024  3:05 PM. If you have any questions, ask your nurse or doctor.          STOP taking these medications    doxycycline  100 MG tablet Commonly known as: VIBRA -TABS Stopped by: Anthony [name]   Vitamin D  (Ergocalciferol ) 1.25 MG (50000 UNIT) Caps capsule Commonly known as: DRISDOL  Stopped by: Anthony [name]  TAKE these medications    albuterol  108 (90 Base) MCG/ACT inhaler Commonly known as: VENTOLIN  HFA Inhale 2 puffs into the lungs every 6 (six) hours as needed for wheezing or shortness of breath.   atorvastatin  20 MG tablet Commonly known as: LIPITOR Take 1 tablet (20 mg total) by mouth daily.   cetirizine  10 MG tablet Commonly known as: ZYRTEC  Take 1 tablet (10 mg total) by mouth daily.   fluticasone  50 MCG/ACT nasal spray Commonly known as: FLONASE  Place 2 sprays into both nostrils daily.   lisinopril  2.5 MG tablet Commonly known as: Zestril  Take 1 tablet (2.5 mg total) by mouth daily.   Symbicort  160-4.5 MCG/ACT inhaler Generic drug: budesonide -formoterol  INHALE 2 PUFFS INTO THE LUNGS TWICE A DAY         ROS:  Constitutional: negative for chills, fatigue, and fevers Eyes: negative for visual  disturbance and pain Ears, nose, mouth, throat, and face: negative for ear drainage, sore throat, and sinus problems Respiratory: negative for cough, wheezing, and shortness of breath Cardiovascular: negative for chest pain and palpitations Gastrointestinal: negative for abdominal pain, nausea, reflux symptoms, and vomiting Genitourinary:negative for dysuria and frequency Integument/breast: negative for dryness and rash Hematologic/lymphatic: negative for bleeding and lymphadenopathy Musculoskeletal:negative for back pain and neck pain Neurological: negative for dizziness and tremors Endocrine: negative for temperature intolerance  Blood pressure 127/81, pulse 61, temperature 98.1 F (36.7 C), temperature source Oral, resp. rate 16, height 5' 11 (1.803 m), weight 229 lb (103.9 kg), SpO2 95%. Physical Exam Vitals reviewed.  Constitutional:      Appearance: Normal appearance.  HENT:     Head: Normocephalic and atraumatic.  Eyes:     Extraocular Movements: Extraocular movements intact.     Pupils: Pupils are equal, round, and reactive to light.  Neck:     Comments: Anterior neck with a cyst with overlying scar from previous I&D, mild surrounding induration without fluctuance or erythema; nontender to palpation Cardiovascular:     Rate and Rhythm: Normal rate and regular rhythm.  Pulmonary:     Effort: Pulmonary effort is normal.     Breath sounds: Normal breath sounds.  Abdominal:     General: There is no distension.     Palpations: Abdomen is soft.     Tenderness: There is no abdominal tenderness.  Musculoskeletal:        General: Normal range of motion.     Cervical back: Normal range of motion.  Skin:    General: Skin is warm and dry.  Neurological:     General: No focal deficit present.     Mental Status: He is alert and oriented to person, place, and time.  Psychiatric:        Mood and Affect: Mood normal.        Behavior: Behavior normal.     Results: No results  found for this or any previous visit (from the past 48 hours).  No results found.   Assessment & Plan:  Anthony [name] is a 63 y.o. male who presents for evaluation of an epidermoid cyst on his neck.  -We discussed the pathophysiology of epidermoid cyst recommendations for removal.  Given that this area did cause a recent infection requiring incision and drainage and antibiotics, to proceed with cyst excision.  Given the active inflammation of the area, I would recommend waiting to schedule cyst excision for couple of weeks -The risk and benefits of neck cyst excision were discussed including but not limited to bleeding, infection, injury  to surrounding structures, need for repeat surgery, cyst recurrence.   -Patient has decided that he would like to think about his options for the time being.   -I will call the patient in 3 weeks for follow-up and discuss possibly scheduling an in office excision of his cyst  All questions were answered to the satisfaction of the patient.  Note: Portions of this report may have been transcribed using voice recognition software. Every effort has been made to ensure accuracy; however, inadvertent computerized transcription errors may still be present.   Dorothyann Brittle, DO [facility] [address] [place], [place] [address] [phone] (office)

## 2024-03-05 ENCOUNTER — Ambulatory Visit: Admitting: Surgery

## 2024-03-07 ENCOUNTER — Ambulatory Visit (INDEPENDENT_AMBULATORY_CARE_PROVIDER_SITE_OTHER): Admitting: Surgery

## 2024-03-07 DIAGNOSIS — L720 Epidermal cyst: Secondary | ICD-10-CM

## 2024-03-07 NOTE — Progress Notes (Signed)
 [facility]  Called to discuss possible cyst excision with the patient.  He states that since his last visit, the area has significantly decreased in size.  It is not visible, and he can only feel a small pea-sized area on palpation.  There is no further redness or irritation.  He states that he is happy to monitor the area and forego cyst excision at this time.  I advised that I be happy to excise the cyst if it began to enlarge in size or caused him further issues.  All questions were answered to his expressed satisfaction.  Will see the patient PRN.   Dorothyann Brittle, DO [facility] [address] [place], [place] [address] [phone] (office)

## 2024-05-09 ENCOUNTER — Other Ambulatory Visit: Payer: Self-pay | Admitting: Internal Medicine

## 2024-05-09 DIAGNOSIS — E1169 Type 2 diabetes mellitus with other specified complication: Secondary | ICD-10-CM

## 2024-05-09 DIAGNOSIS — E119 Type 2 diabetes mellitus without complications: Secondary | ICD-10-CM
